# Patient Record
Sex: Female | Born: 1960 | Race: White | Hispanic: No | Marital: Single | Smoking: Never smoker
Health system: Southern US, Community
[De-identification: ages and names within clinical notes are randomized; demographics above are authoritative.]

## PROBLEM LIST (undated history)

## (undated) DIAGNOSIS — K5090 Crohn's disease, unspecified, without complications: Secondary | ICD-10-CM

## (undated) DIAGNOSIS — C4491 Basal cell carcinoma of skin, unspecified: Secondary | ICD-10-CM

## (undated) DIAGNOSIS — T7840XA Allergy, unspecified, initial encounter: Secondary | ICD-10-CM

## (undated) HISTORY — DX: Basal cell carcinoma of skin, unspecified: C44.91

## (undated) HISTORY — PX: DERMOID CYST  EXCISION: SHX1452

## (undated) HISTORY — DX: Crohn's disease, unspecified, without complications: K50.90

## (undated) HISTORY — PX: COLONOSCOPY: SHX174

## (undated) HISTORY — DX: Allergy, unspecified, initial encounter: T78.40XA

## (undated) HISTORY — PX: KNEE ARTHROSCOPY: SHX127

## (undated) HISTORY — PX: OTHER SURGICAL HISTORY: SHX169

---

## 2007-04-21 ENCOUNTER — Encounter: Admission: RE | Admit: 2007-04-21 | Discharge: 2007-04-21 | Payer: Self-pay | Admitting: Sports Medicine

## 2008-07-11 ENCOUNTER — Ambulatory Visit: Payer: Self-pay | Admitting: Sports Medicine

## 2008-12-25 ENCOUNTER — Ambulatory Visit: Payer: Self-pay | Admitting: Family Medicine

## 2009-05-02 ENCOUNTER — Ambulatory Visit: Payer: Self-pay | Admitting: Sports Medicine

## 2009-11-20 ENCOUNTER — Encounter: Admission: RE | Admit: 2009-11-20 | Discharge: 2009-11-20 | Payer: Self-pay | Admitting: Family Medicine

## 2009-11-20 ENCOUNTER — Ambulatory Visit: Payer: Self-pay | Admitting: Family Medicine

## 2009-11-28 ENCOUNTER — Ambulatory Visit: Payer: Self-pay | Admitting: Family Medicine

## 2010-01-08 ENCOUNTER — Encounter: Admission: RE | Admit: 2010-01-08 | Discharge: 2010-01-08 | Payer: Self-pay | Admitting: Podiatry

## 2011-01-16 ENCOUNTER — Ambulatory Visit: Payer: Self-pay | Admitting: Family Medicine

## 2011-03-07 ENCOUNTER — Encounter: Payer: Self-pay | Admitting: Family Medicine

## 2011-06-24 ENCOUNTER — Ambulatory Visit (INDEPENDENT_AMBULATORY_CARE_PROVIDER_SITE_OTHER): Payer: BC Managed Care – PPO | Admitting: Family Medicine

## 2011-06-24 ENCOUNTER — Encounter: Payer: Self-pay | Admitting: Family Medicine

## 2011-06-24 VITALS — BP 120/80 | HR 72 | Wt 160.0 lb

## 2011-06-24 DIAGNOSIS — M25512 Pain in left shoulder: Secondary | ICD-10-CM

## 2011-06-24 DIAGNOSIS — M25519 Pain in unspecified shoulder: Secondary | ICD-10-CM

## 2011-06-24 NOTE — Patient Instructions (Signed)
Call me if you have any questions

## 2011-06-24 NOTE — Progress Notes (Signed)
  Subjective:    Patient ID: Ruth Carpenter, female    DOB: Oct 27, 1960, 50 y.o.   MRN: [id number]  HPI She is here for dilation of left shoulder pain. She has a history of injury Ms. several years ago. She did have an MRI in 2010 which did show questionable tendon damage. She is again had difficulty recently and was seen by an orthopedic surgeon. An ultrasound is scheduled in the future. She is concerned about the fact that the pain is more than just in her shoulder. There is a questionable history of lymph node swelling in the axilla from 2 years ago.   Review of Systems     Objective:   Physical Exam Alert and in no distress. Exam of her neck shows no adenopathy. Axillary exam again showed no adenopathy.       Assessment & Plan:  Left shoulder pain. Explained to her that the adenopathy from 2 years ago is not significant based on the fact that nothing is present now and if this was of any significance, it would've 40 blossomed by this point. She understands this and will followup as needed .

## 2011-10-27 ENCOUNTER — Ambulatory Visit: Payer: Self-pay | Admitting: Family Medicine

## 2012-05-11 HISTORY — PX: ROTATOR CUFF REPAIR: SHX139

## 2012-06-01 DIAGNOSIS — M25512 Pain in left shoulder: Secondary | ICD-10-CM | POA: Insufficient documentation

## 2013-05-24 ENCOUNTER — Telehealth: Payer: Self-pay | Admitting: Family Medicine

## 2013-05-24 NOTE — Telephone Encounter (Signed)
Sure--as long as the wait for a CPE appt isn't a problem for her

## 2013-05-25 NOTE — Telephone Encounter (Signed)
Pt scheduled appt

## 2013-05-26 ENCOUNTER — Encounter: Payer: Self-pay | Admitting: Family Medicine

## 2013-05-26 ENCOUNTER — Ambulatory Visit (INDEPENDENT_AMBULATORY_CARE_PROVIDER_SITE_OTHER): Payer: BC Managed Care – PPO | Admitting: Family Medicine

## 2013-05-26 VITALS — BP 110/80 | HR 68 | Ht 67.0 in | Wt 166.0 lb

## 2013-05-26 DIAGNOSIS — M25559 Pain in unspecified hip: Secondary | ICD-10-CM

## 2013-05-26 DIAGNOSIS — K5090 Crohn's disease, unspecified, without complications: Secondary | ICD-10-CM

## 2013-05-26 DIAGNOSIS — M545 Low back pain: Secondary | ICD-10-CM

## 2013-05-26 DIAGNOSIS — M25552 Pain in left hip: Secondary | ICD-10-CM

## 2013-05-26 LAB — POCT URINALYSIS DIPSTICK
Bilirubin, UA: NEGATIVE
Glucose, UA: NEGATIVE
Ketones, UA: NEGATIVE
Leukocytes, UA: NEGATIVE
Urobilinogen, UA: NEGATIVE
pH, UA: 5

## 2013-05-26 MED ORDER — MELOXICAM 15 MG PO TABS: 15.0000 mg | ORAL_TABLET | Freq: Every day | ORAL | Status: DC

## 2013-05-26 NOTE — Patient Instructions (Signed)
Consider chiropractor if not improving--([facility], Dr. Thereasa Distance or his partner Clayburn Pert, or [facility]ien (?Peters?)--preferably one who performs Active Release Technique  Continue to use heat, stretches. Use prilosec or zantac or pepcid as needed for stomach pain related to medication.  Get flu shot at school

## 2013-05-26 NOTE — Progress Notes (Signed)
Chief Complaint  Patient presents with  . Hip Pain    left hip pain and lower back pain x 6-8 weeks. Worse at night when she is lying down. Has been getting HA more frequently than usual lately. Unsure if she wants flu vaccine. Patient does want it noted that she will  not take any steroidal medications due to a personal preference, not an allergy.    She was feeling bloated, feels like she gets full easy, even with drinking water.  Thought it might have been her Crohns at first, but now doesn't think it is.  She has had some L hip pain for a bit, but then improved.  Pain has been going on for 6-8 weeks.  Now she is having some discomfort in her back, notices pain with lying in bed at night.  She had muscle spasms in her lower back with crohn's flare in the past, but this feels different.  Lying flat causes the most pain, ocross the left side of her lower back, not into the buttock.  It hurts for her to move her leg to get up.  Feels better once she is up and moving around.  Denies radiation of pain into leg, or any numbness or tingling.  With prolonged sitting, she has some pain in L low back and lateral hip.  Did some stretches with a physical therapist, didn't help much (other than the pain moving from the hip more to to the back).  Denies any fall, injury or change in activity.  She took 800mg  of ibuprofen twice daily at onset of pain, for about a week early on in the course of pain. Didn't seem to help.  Feels like Aleve makes her swell, no problems with ibuprofen.  Bowels are "normal for me"--no blood in stool, diarrhea or abdominal pain.  Sometimes certain foods will bother her stomach. Denies any heartburn.Denies nausea or vomiting but some mild early satiety.  Past Medical History  Diagnosis Date  . Crohn's disease     GI in [place], [place]  . Allergy     h/o in past--no ongoing issues   Past Surgical History  Procedure Laterality Date  . Rotator cuff repair Left 05/2012    bicep release,  rotator cuff and labrum repair ([facility]; Dr. Ardine Eng)  . Knee arthroscopy Bilateral     meniscus tears  . Salivary gland excision    . Dermoid cyst  excision Right age 84's    with R ovarectomy   History   Social History  . Marital Status: Single    Spouse Name: N/A    Number of Children: N/A  . Years of Education: N/A   Occupational History  . head softball coach [employer]   Social History Main Topics  . Smoking status: Never Smoker   . Smokeless tobacco: Never Used  . Alcohol Use: No     Comment: no drinks 6-7 years.   . Drug Use: No  . Sexual Activity: Not on file   Other Topics Concern  . Not on file   Social History Narrative   Lives with a roommate Vinnie Langton), 2 [place]   Current Outpatient Prescriptions on File Prior to Visit  Medication Sig Dispense Refill  . fish oil-omega-3 fatty acids 1000 MG capsule Take 1 g by mouth daily.       . Multiple Vitamins-Calcium (ONE-A-DAY WOMENS) tablet Take 1 tablet by mouth daily.         No current facility-administered medications on file  prior to visit.   Allergies  Allergen Reactions  . Guaifenesin Er Diarrhea and Nausea Only  . Morphine And Related Hives and Itching   ROS:  No cough, shortness of breath.  Has throat-clearing in the mornings, but denies any allergy symptoms. Not sleeping great due to back pain, so a little tired during the day.  No fevers, chills, nausea, vomiting. No skin rashes, bleeding/bruising.  No other joint pains.  Denies urinary urgency, dysuria.  Some frequency, but drinks a lot of water.  PHYSICAL EXAM: BP 110/80  Pulse 68  Ht 5\' 7"  (1.702 m)  Wt 166 lb (75.297 kg)  BMI 25.99 kg/m2 Well developed, pleasant female in no distress Neck: no lymphadenopathy or mass Heart: regular rate and rhythm without murmur Lungs: clear bilaterally Abdomen: Mild epigastric tenderness, and mild diffuse tenderness across lower abdomen. No rebound tendernes or guarding. Normal bowel sounds.  No organomegaly  or mass Back: No spinal tendreness, no CVA tenderness, no SI joint tenderness. There is mild asymmetry of paraspinous muscles, somewhat thicker (mild spasm) on the left.  Mild diffuse tenderness in this area.  nontender over sciatic notch and trochanteric bursa. nontender at ASIS.  FROM of hip. No pain with pyriformis stretch, normal ROM  ASSESSMENT/PLAN:  LBP (low back pain) - Plan: POCT Urinalysis Dipstick, meloxicam (MOBIC) 15 MG tablet  Left hip pain - Plan: meloxicam (MOBIC) 15 MG tablet  Crohn's disease  Trial of heat, stretches, NSAIDS NSAID precautions reviewed. Consider chiropractor if not improving. Use prilosec or zantac or pepcid as needed for stomach pain related to medication.  F/u with GI for GI complaints   Declines flu shots.  Encouraged to get at [facility].

## 2013-08-12 ENCOUNTER — Other Ambulatory Visit (INDEPENDENT_AMBULATORY_CARE_PROVIDER_SITE_OTHER): Payer: BC Managed Care – PPO

## 2013-08-12 DIAGNOSIS — Z23 Encounter for immunization: Secondary | ICD-10-CM

## 2013-10-10 ENCOUNTER — Encounter: Payer: Self-pay | Admitting: Family Medicine

## 2013-10-10 ENCOUNTER — Other Ambulatory Visit (HOSPITAL_COMMUNITY)
Admission: RE | Admit: 2013-10-10 | Discharge: 2013-10-10 | Disposition: A | Discharge status: Home / Self Care | Payer: BC Managed Care – PPO | Source: Ambulatory Visit | Attending: Family Medicine | Admitting: Family Medicine

## 2013-10-10 ENCOUNTER — Ambulatory Visit (INDEPENDENT_AMBULATORY_CARE_PROVIDER_SITE_OTHER): Payer: BC Managed Care – PPO | Admitting: Family Medicine

## 2013-10-10 VITALS — BP 116/72 | HR 64 | Ht 66.25 in | Wt 155.0 lb

## 2013-10-10 DIAGNOSIS — M25511 Pain in right shoulder: Secondary | ICD-10-CM

## 2013-10-10 DIAGNOSIS — Z1151 Encounter for screening for human papillomavirus (HPV): Secondary | ICD-10-CM | POA: Insufficient documentation

## 2013-10-10 DIAGNOSIS — Z01419 Encounter for gynecological examination (general) (routine) without abnormal findings: Secondary | ICD-10-CM | POA: Insufficient documentation

## 2013-10-10 DIAGNOSIS — M25519 Pain in unspecified shoulder: Secondary | ICD-10-CM

## 2013-10-10 DIAGNOSIS — K5090 Crohn's disease, unspecified, without complications: Secondary | ICD-10-CM

## 2013-10-10 DIAGNOSIS — E28319 Asymptomatic premature menopause: Secondary | ICD-10-CM

## 2013-10-10 DIAGNOSIS — Z8249 Family history of ischemic heart disease and other diseases of the circulatory system: Secondary | ICD-10-CM

## 2013-10-10 DIAGNOSIS — Z23 Encounter for immunization: Secondary | ICD-10-CM

## 2013-10-10 DIAGNOSIS — Z0000 Encounter for general adult medical examination without abnormal findings: Secondary | ICD-10-CM

## 2013-10-10 LAB — CBC WITH DIFFERENTIAL/PLATELET
BASOS ABS: 0 10*3/uL (ref 0.0–0.1)
Basophils Relative: 0 % (ref 0–1)
EOS ABS: 0.1 10*3/uL (ref 0.0–0.7)
EOS PCT: 1 % (ref 0–5)
HCT: 39.8 % (ref 36.0–46.0)
Hemoglobin: 13.7 g/dL (ref 12.0–15.0)
LYMPHS ABS: 1.7 10*3/uL (ref 0.7–4.0)
LYMPHS PCT: 30 % (ref 12–46)
MCH: 29.3 pg (ref 26.0–34.0)
MCHC: 34.4 g/dL (ref 30.0–36.0)
MCV: 85 fL (ref 78.0–100.0)
Monocytes Absolute: 0.3 10*3/uL (ref 0.1–1.0)
Monocytes Relative: 6 % (ref 3–12)
NEUTROS ABS: 3.7 10*3/uL (ref 1.7–7.7)
NEUTROS PCT: 63 % (ref 43–77)
PLATELETS: 229 10*3/uL (ref 150–400)
RBC: 4.68 MIL/uL (ref 3.87–5.11)
RDW: 13.6 % (ref 11.5–15.5)
WBC: 5.8 10*3/uL (ref 4.0–10.5)

## 2013-10-10 LAB — POCT URINALYSIS DIPSTICK
BILIRUBIN UA: NEGATIVE
Blood, UA: NEGATIVE
GLUCOSE UA: NEGATIVE
KETONES UA: NEGATIVE
Leukocytes, UA: NEGATIVE
NITRITE UA: NEGATIVE
PH UA: 5
Protein, UA: NEGATIVE
SPEC GRAV UA: 1.02
Urobilinogen, UA: NEGATIVE

## 2013-10-10 NOTE — Progress Notes (Signed)
Chief Complaint  Patient presents with  . fasting physical    fasting physical with pap.    Ruth Carpenter is a 53 y.o. female who presents for a complete physical.  She has the following concerns:  She fell at work on Friday, and is having right shoulder pain (with movement).  She has taken Tylenol (doesn't take NSAIDs because it messes up her stomach), and iced it through the weekend.  Has some soreness in her triceps, where she landed.  She was last seen for LBP in October.  Saw Damien (chiro) for a while.  Got busy with work.  Still having some back pain (overall improved, but persistent).  Plans to f/u with [facility] when she has time.  On Nutrisystem to eat healthier, lose weight. She started about 6-7 weeks ago, and lost 14 pounds, and "feels better".   Immunization History  Administered Date(s) Administered  . Influenza,inj,Quad PF,36+ Mos 08/12/2013  . Tdap 10/10/2013   Last Pap smear: 2008, no h/o abnormal paps Last mammogram: probably also in 2008, has been skeptical about getting them Last colonoscopy: per GI in [place], [place], thinks it was at her time of diagnosis, over 7-10 years ago. Last DEXA: unsure Periods stopped around the time of her diagnosis of Crohn's (7-10 years ago). Dentist: recent, every 6 months Ophtho: 2 years ago, wears glasses Exercise:  Active throughout the day (hitting balls, walks).  Not always aerobic.  Past Medical History  Diagnosis Date  . Crohn's disease     GI in [place], [place]  . Allergy     h/o in past--no ongoing issues  . Skin cancer, basal cell     Dr. Ubaldo Glassing (right shoulder, RLE, back, chest)    Past Surgical History  Procedure Laterality Date  . Rotator cuff repair Left 05/2012    bicep release, rotator cuff and labrum repair ([facility]; Dr. Gerald Dexter)  . Knee arthroscopy Bilateral     meniscus tears  . Salivary gland excision    . Dermoid cyst  excision Right age 62's    with R ovarectomy    History   Social History  . Marital  Status: Single    Spouse Name: N/A    Number of Children: N/A  . Years of Education: N/A   Occupational History  . head softball coach [place] History Main Topics  . Smoking status: Never Smoker   . Smokeless tobacco: Never Used  . Alcohol Use: No     Comment: no drinks over 7 years.   . Drug Use: No  . Sexual Activity: Not Currently   Other Topics Concern  . Not on file   Social History Narrative   Lives with a roommate [name]), 2 Beagles    Family History  Problem Relation Age of Onset  . Heart disease Mother     CABG at 65; smoker  . Diabetes Mother   . Hypertension Mother   . Heart disease Father 21    died of massive MI at 74  . Hypertension Father   . Hyperlipidemia Sister   . Hypertension Sister   . Diabetes Sister   . Hyperlipidemia Brother   . Hypertension Brother   . Diabetes Brother   . Cancer Maternal Aunt     ?type  . Cancer Maternal Grandmother     ?type  . Hyperlipidemia Brother   . Hypertension Brother   . Hyperlipidemia Brother   . Hypertension Brother   . Hyperlipidemia Sister   .  Hypertension Sister   . Diabetes Other    Outpatient Encounter Prescriptions as of 10/10/2013  Medication Sig  . cholecalciferol (VITAMIN D) 1000 UNITS tablet Take 1,000 Units by mouth daily.  . fish oil-omega-3 fatty acids 1000 MG capsule Take 1 g by mouth daily.   . Multiple Vitamins-Calcium (ONE-A-DAY WOMENS) tablet Take 1 tablet by mouth daily.    Marland Kitchen acetaminophen (TYLENOL) 325 MG tablet Take 650 mg by mouth as needed for pain.  . [DISCONTINUED] meloxicam (MOBIC) 15 MG tablet Take 1 tablet (15 mg total) by mouth daily.    Allergies  Allergen Reactions  . Guaifenesin Er Diarrhea and Nausea Only  . Morphine And Related Hives and Itching    ROS:  The patient denies anorexia, fever, headaches,  vision changes, decreased hearing, ear pain, sore throat, breast concerns, chest pain, palpitations, dizziness, syncope, dyspnea on exertion, cough,  swelling, nausea, vomiting, diarrhea, constipation, abdominal pain, melena, hematochezia, indigestion/heartburn, hematuria, incontinence, dysuria, vaginal bleeding, discharge, odor or itch, genital lesions, joint pains, numbness, tingling, weakness, tremor, suspicious skin lesions, depression, anxiety, abnormal bleeding/bruising, or enlarged lymph nodes. +intentional weight loss. Occasional bloating.  Denies any Crohn's symptoms (diarrhea, blood in stool).  Has been off Remicaid for years. +right shoulder pain per HPI. +rash on her abdomen sometimes all over, worse after shower.  She plans to see dermatologist soon (sees her regularly)  PHYSICAL EXAM: BP 116/72  Pulse 64  Ht 5' 6.25" (1.683 m)  Wt 155 lb (70.308 kg)  BMI 24.82 kg/m2  General Appearance:    Alert, cooperative, no distress, appears stated age  Head:    Normocephalic, without obvious abnormality, atraumatic  Eyes:    PERRL, conjunctiva/corneas clear, EOM's intact, fundi    benign  Ears:    Normal TM's and external ear canals. Mild TM scarring bilaterally.   Nose:   Nares normal, no drainage or sinus   Tenderness.  L nare--mild congestion, clear mucus  Throat:   Lips, mucosa, and tongue normal; teeth and gums normal  Neck:   Supple, no lymphadenopathy;  thyroid:  no   enlargement/tenderness/nodules; no carotid   bruit or JVD  Back:    Spine nontender, no curvature, ROM normal, no CVA     tenderness  Lungs:     Clear to auscultation bilaterally without wheezes, rales or     ronchi; respirations unlabored  Chest Wall:    No tenderness or deformity   Heart:    Regular rate and rhythm, S1 and S2 normal, no murmur, rub   or gallop  Breast Exam:    No tenderness, masses, or nipple discharge or inversion.      No axillary lymphadenopathy  Abdomen:     Soft, non-tender, nondistended, normoactive bowel sounds,    no masses, no hepatosplenomegaly  Genitalia:    Normal external genitalia without lesions.  Mild atrophic changes are  noted.  She had discomfort with speculum exam, limited ability to open the speculum.  Cervix was partially visualized, and appeared normal.  There was a stenotic cervical os. BUS and vagina normal; No abnormal vaginal discharge.  Uterus and adnexa not enlarged, nontender, no masses.  Pap performed  Rectal:    Normal tone, no masses or tenderness; guaiac negative stool  Extremities:   No clubbing, cyanosis or edema. Right shoulder: Painful arc, worse with abduction than forward flexion.  FROM.  Pain with supraspinatous testing--gave way due to pain (cannot r/o weakness). Rest of RC muscles had normal strength.  Pulses:  2+ and symmetric all extremities  Skin:   Skin color, texture, turgor normal; -mildly dry on abdomen, with some excoriation R abdomen.  Lymph nodes:   Cervical, supraclavicular, and axillary nodes normal  Neurologic:   CNII-XII intact, normal strength, sensation and gait; reflexes 2+ and symmetric throughout          Psych:   Normal mood, affect, hygiene and grooming.     ASSESSMENT/PLAN:  Routine general medical examination at a health care facility - Plan: POCT urinalysis dipstick, Lipid panel, Comprehensive metabolic panel, CBC with Differential, TSH, Cytology - PAP [place]  Crohn's disease - Plan: Comprehensive metabolic panel, CBC with Differential, DG Bone Density  Family history of early CAD - Plan: Lipid panel  Need for Tdap vaccination - Plan: Tdap vaccine greater than or equal to 7yo IM  Early menopause - Plan: DG Bone Density  Right shoulder pain - f/u with Work[employer]'s [employer].  may have RC injury.  shown ROM exercises.  doesn't tolerate NSAID--consider steroid injection (through [facility])  DEXA recommended (given early menopause). Encouraged to get mammograms.  Counseled re: risks, benefits, potential consequences of delay in diagnosis of cancer. She will consider. Pt to schedule colonoscopy  Discussed monthly self breast exams and yearly mammograms after the age of  38; at least 30 minutes of aerobic activity at least 5 days/week; proper sunscreen use reviewed; healthy diet, including goals of calcium and vitamin D intake and alcohol recommendations (less than or equal to 1 drink/day) reviewed; regular seatbelt use; changing batteries in smoke detectors.  Immunization recommendations discussed--Tdap given.  Colonoscopy recommendations reviewed--due, pt to schedule (GI in [place])

## 2013-10-10 NOTE — Patient Instructions (Addendum)
  HEALTH MAINTENANCE RECOMMENDATIONS:  It is recommended that you get at least 30 minutes of aerobic exercise at least 5 days/week (for weight loss, you may need as much as 60-90 minutes). This can be any activity that gets your heart rate up. This can be divided in 10-15 minute intervals if needed, but try and build up your endurance at least once a week.  Weight bearing exercise is also recommended twice weekly.  Eat a healthy diet with lots of vegetables, fruits and fiber.  "Colorful" foods have a lot of vitamins (ie green vegetables, tomatoes, red peppers, etc).  Limit sweet tea, regular sodas and alcoholic beverages, all of which has a lot of calories and sugar.  Up to 1 alcoholic drink daily may be beneficial for women (unless trying to lose weight, watch sugars).  Drink a lot of water.  Calcium recommendations are 1200-1500 mg daily (1500 mg for postmenopausal women or women without ovaries), and vitamin D 1000 IU daily.  This should be obtained from diet and/or supplements (vitamins), and calcium should not be taken all at once, but in divided doses.  Monthly self breast exams and yearly mammograms for women over the age of 50 is recommended.  Sunscreen of at least SPF 30 should be used on all sun-exposed parts of the skin when outside between the hours of 10 am and 4 pm (not just when at beach or pool, but even with exercise, golf, tennis, and yard work!)  Use a sunscreen that says "broad spectrum" so it covers both UVA and UVB rays, and make sure to reapply every 1-2 hours.  Remember to change the batteries in your smoke detectors when changing your clock times in the spring and fall.  Use your seat belt every time you are in a car, and please drive safely and not be distracted with cell phones and texting while driving.  Please consider mammogram (you can get at the same place, possibly scheduled on same day, as your bone density).  Please schedule your colonoscopy.

## 2013-10-11 LAB — COMPREHENSIVE METABOLIC PANEL
ALT: 15 U/L (ref 0–35)
AST: 19 U/L (ref 0–37)
Albumin: 4.7 g/dL (ref 3.5–5.2)
Alkaline Phosphatase: 52 U/L (ref 39–117)
BUN: 16 mg/dL (ref 6–23)
CHLORIDE: 105 meq/L (ref 96–112)
CO2: 27 meq/L (ref 19–32)
CREATININE: 0.77 mg/dL (ref 0.50–1.10)
Calcium: 9.9 mg/dL (ref 8.4–10.5)
Glucose, Bld: 85 mg/dL (ref 70–99)
Potassium: 4.7 mEq/L (ref 3.5–5.3)
Sodium: 141 mEq/L (ref 135–145)
TOTAL PROTEIN: 7.1 g/dL (ref 6.0–8.3)
Total Bilirubin: 1 mg/dL (ref 0.2–1.2)

## 2013-10-11 LAB — LIPID PANEL
Cholesterol: 173 mg/dL (ref 0–200)
HDL: 54 mg/dL (ref >39)
LDL CALC: 94 mg/dL (ref 0–99)
TRIGLYCERIDES: 125 mg/dL (ref <150)
Total CHOL/HDL Ratio: 3.2 Ratio
VLDL: 25 mg/dL (ref 0–40)

## 2013-10-11 LAB — TSH: TSH: 1.597 u[IU]/mL (ref 0.350–4.500)

## 2013-10-12 ENCOUNTER — Ambulatory Visit: Payer: Self-pay | Admitting: Medical

## 2013-10-19 ENCOUNTER — Ambulatory Visit (INDEPENDENT_AMBULATORY_CARE_PROVIDER_SITE_OTHER): Payer: BC Managed Care – PPO | Admitting: Family Medicine

## 2013-10-19 VITALS — BP 112/80 | HR 76 | Temp 99.6°F | Resp 16 | Ht 67.0 in | Wt 159.0 lb

## 2013-10-19 DIAGNOSIS — R52 Pain, unspecified: Secondary | ICD-10-CM

## 2013-10-19 DIAGNOSIS — R509 Fever, unspecified: Secondary | ICD-10-CM

## 2013-10-19 DIAGNOSIS — J101 Influenza due to other identified influenza virus with other respiratory manifestations: Secondary | ICD-10-CM

## 2013-10-19 DIAGNOSIS — J029 Acute pharyngitis, unspecified: Secondary | ICD-10-CM

## 2013-10-19 DIAGNOSIS — J111 Influenza due to unidentified influenza virus with other respiratory manifestations: Secondary | ICD-10-CM

## 2013-10-19 LAB — POCT INFLUENZA A/B
Influenza A, POC: POSITIVE
Influenza B, POC: NEGATIVE

## 2013-10-19 MED ORDER — HYDROCODONE-HOMATROPINE 5-1.5 MG/5ML PO SYRP: 5.0000 mL | ORAL_SOLUTION | ORAL | Status: DC | PRN

## 2013-10-19 MED ORDER — BENZONATATE 100 MG PO CAPS: 100.0000 mg | ORAL_CAPSULE | Freq: Three times a day (TID) | ORAL | Status: DC | PRN

## 2013-10-19 MED ORDER — OSELTAMIVIR PHOSPHATE 75 MG PO CAPS: 75.0000 mg | ORAL_CAPSULE | Freq: Two times a day (BID) | ORAL | Status: DC

## 2013-10-19 NOTE — Patient Instructions (Signed)
Drink plenty of fluids and get enough rest  You will be infectious for several days  Take the Tamiflu one twice daily  Use the hydrocodone cough syrup 1 teaspoon every 4-6 hours as needed. It is sedating.  Use the Tessalon cough pills one every 6 or 8 hours (3 in 24 hours) as needed for cough  Return in worse

## 2013-10-19 NOTE — Progress Notes (Signed)
Subjective: Patient is here because she got sick last night. She's had a cough. She has body aches. Is going for a temperature of 100.9 at home. Her throat is sore. 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  Objective: Pleasant lady who doesn't feel well. Her TMs are normal. Throat is only minimally erythematous. Neck supple without significant nodes. Chest clear to auscultation. Deep inspiration cause of her cough. Heart regular without murmurs. She looks ill.  Assessment: Flulike illness  Plan: Flu test Results for orders placed in visit on 10/19/13  POCT INFLUENZA A/B      Result Value Ref Range   Influenza A, POC Positive     Influenza B, POC Negative     Tamiflu, Hycodan, Tessalon

## 2013-11-01 ENCOUNTER — Other Ambulatory Visit: Payer: Self-pay | Admitting: Orthopedic Surgery

## 2013-11-01 DIAGNOSIS — M25511 Pain in right shoulder: Secondary | ICD-10-CM

## 2013-11-10 ENCOUNTER — Ambulatory Visit
Admission: RE | Admit: 2013-11-10 | Discharge: 2013-11-10 | Disposition: A | Discharge status: Home / Self Care | Payer: Worker's Compensation | Source: Ambulatory Visit | Attending: Orthopedic Surgery | Admitting: Orthopedic Surgery

## 2013-11-10 DIAGNOSIS — M25511 Pain in right shoulder: Secondary | ICD-10-CM

## 2013-11-10 MED ORDER — IOHEXOL 180 MG/ML  SOLN
15.0000 mL | Freq: Once | INTRAMUSCULAR | Status: AC | PRN
  Administered 2013-11-10: 15 mL via INTRA_ARTICULAR

## 2014-06-23 ENCOUNTER — Other Ambulatory Visit: Payer: Self-pay | Admitting: Dermatology

## 2014-08-09 ENCOUNTER — Ambulatory Visit (INDEPENDENT_AMBULATORY_CARE_PROVIDER_SITE_OTHER): Payer: BC Managed Care – PPO | Admitting: Family Medicine

## 2014-08-09 ENCOUNTER — Encounter: Payer: Self-pay | Admitting: Family Medicine

## 2014-08-09 VITALS — BP 100/68 | HR 88 | Temp 98.4°F | Ht 66.25 in | Wt 154.0 lb

## 2014-08-09 DIAGNOSIS — J029 Acute pharyngitis, unspecified: Secondary | ICD-10-CM

## 2014-08-09 DIAGNOSIS — J069 Acute upper respiratory infection, unspecified: Secondary | ICD-10-CM

## 2014-08-09 LAB — POCT RAPID STREP A (OFFICE): RAPID STREP A SCREEN: NEGATIVE

## 2014-08-09 NOTE — Patient Instructions (Signed)
  Drink plenty of fluids. Continue the decongestant (phenylphrine or pseudoephedrine), continue expectorant (guaifenesin).  Continue to use either tylenol and/or ibuprofen as needed for pain. Consider chloraseptic spray and/or salt water gargles for sore throat. If/when cough worsens you can use a cough suppressant such as dextromethorphan (DM in many of the combination medications, or Delsym syrup).  Expect symptoms of congestion and cough to worsen before they improve.  Call on Monday if symptoms are clearly worse rather than better.  Contact me through MyChart over the weekend if symptoms change, high fevers, etc.

## 2014-08-09 NOTE — Progress Notes (Signed)
Chief Complaint  Patient presents with  . Sore Throat    started yesterday with scratchy throat. By middle of the night she was having swallowing trouble. Coughing up brown stuff.    Yesterday she started with scratchy throat.  She started taking tylenol sinus severe (APAP, guaifenesin and phenylephrine).  Sore throat has gotten progressively worse.  She has postnasal drainage, and the phlegm is brown.  She is having some head congestion (mild).  Her ears are bothering her just a little.  No sinus pain.  She has felt a little feverish (her temp was 98.7, which is up from her usual numbers of 96.1)  +sick contacts (recent visit with family)  [facility], [place], [place] reviewed. Current Outpatient Prescriptions on File Prior to Visit  Medication Sig Dispense Refill  . cholecalciferol (VITAMIN D) 1000 UNITS tablet Take 1,000 Units by mouth daily.    . fish oil-omega-3 fatty acids 1000 MG capsule Take 1 g by mouth daily.     . Multiple Vitamins-Calcium (ONE-A-DAY WOMENS) tablet Take 1 tablet by mouth daily.      Marland Kitchen acetaminophen (TYLENOL) 325 MG tablet Take 650 mg by mouth as needed for pain.     No current facility-administered medications on file prior to visit.   Allergies  Allergen Reactions  . Guaifenesin Er Diarrhea and Nausea Only  . Morphine And Related Hives and Itching   ROS: No headaches, dizziness, nausea, vomiting, diarrhea.  No rashes. No urinary complaints, myalgias, chest pain, shortness of breath or cough.  See HPI.  PHYSICAL EXAM: BP 100/68 mmHg  Pulse 88  Temp(Src) 98.4 F (36.9 C) (Tympanic)  Ht 5' 6.25" (1.683 m)  Wt 154 lb (69.854 kg)  BMI 24.66 kg/m2  Well appearing female in no distress.  No coughing, occasional sniffle HEENT:  PERRL, EOMI, conjunctiva clear.  TM's and EAC's are normal.  Nasal mucosa is moderately to severely edematous on the left, mild-mod on the right.  Clear-white mucus is present.  Sinuses are nontender.  OP--there is erythema bilaterally and posteriorly.   The tonsils are not enlarged or red, no exudates.  Moist mucus membranes Neck: no lymphadenopathy or mass Heart: regular rate and rhythm Lungs: clear bilaterally Skin: no rash  Rapid strep negative  ASSESSMENT/PLAN:  Acute upper respiratory infection - viral.  supportive measures reviewed.  Sore throat - Plan: Rapid Strep A  She has listed intolerance to guaifenesin, but seems to be taking it without diarrhea in her OTC meds.  Advised to continue.   Drink plenty of fluids. Continue the decongestant (phenylphrine or pseudoephedrine), continue expectorant (guaifenesin).  Continue to use either tylenol and/or ibuprofen as needed for pain. Consider chloraseptic spray and/or salt water gargles for sore throat. If/when cough worsens you can use a cough suppressant such as dextromethorphan (DM in many of the combination medications, or Delsym syrup).  Expect symptoms of congestion and cough to worsen before they improve. Call next week if not improving or if worse.

## 2015-02-08 ENCOUNTER — Ambulatory Visit
Admission: RE | Admit: 2015-02-08 | Discharge: 2015-02-08 | Disposition: A | Discharge status: Home / Self Care | Payer: BLUE CROSS/BLUE SHIELD | Source: Ambulatory Visit | Attending: Internal Medicine | Admitting: Internal Medicine

## 2015-02-08 ENCOUNTER — Other Ambulatory Visit: Payer: Self-pay | Admitting: Internal Medicine

## 2015-02-08 DIAGNOSIS — M25551 Pain in right hip: Secondary | ICD-10-CM

## 2015-02-28 ENCOUNTER — Other Ambulatory Visit: Payer: Self-pay | Admitting: Family Medicine

## 2015-02-28 DIAGNOSIS — M25551 Pain in right hip: Secondary | ICD-10-CM

## 2015-03-12 ENCOUNTER — Ambulatory Visit
Admission: RE | Admit: 2015-03-12 | Discharge: 2015-03-12 | Disposition: A | Discharge status: Home / Self Care | Payer: BLUE CROSS/BLUE SHIELD | Source: Ambulatory Visit | Attending: Family Medicine | Admitting: Family Medicine

## 2015-03-12 DIAGNOSIS — M25551 Pain in right hip: Secondary | ICD-10-CM

## 2015-03-29 DIAGNOSIS — M1611 Unilateral primary osteoarthritis, right hip: Secondary | ICD-10-CM | POA: Insufficient documentation

## 2015-11-09 ENCOUNTER — Ambulatory Visit
Admission: RE | Admit: 2015-11-09 | Discharge: 2015-11-09 | Disposition: A | Discharge status: Home / Self Care | Payer: BLUE CROSS/BLUE SHIELD | Source: Ambulatory Visit | Attending: Family Medicine | Admitting: Family Medicine

## 2015-11-09 ENCOUNTER — Other Ambulatory Visit: Payer: Self-pay | Admitting: Family Medicine

## 2015-11-09 DIAGNOSIS — M898X1 Other specified disorders of bone, shoulder: Secondary | ICD-10-CM

## 2015-11-09 DIAGNOSIS — G8929 Other chronic pain: Secondary | ICD-10-CM

## 2015-11-09 DIAGNOSIS — R05 Cough: Secondary | ICD-10-CM

## 2015-11-09 DIAGNOSIS — R059 Cough, unspecified: Secondary | ICD-10-CM

## 2016-05-28 ENCOUNTER — Encounter: Payer: Self-pay | Admitting: *Deleted

## 2016-11-25 ENCOUNTER — Ambulatory Visit: Payer: Self-pay | Admitting: Medical

## 2016-11-25 VITALS — BP 122/82 | HR 78 | Temp 98.4°F | Resp 16 | Ht 67.0 in | Wt 162.0 lb

## 2016-11-25 DIAGNOSIS — J111 Influenza due to unidentified influenza virus with other respiratory manifestations: Secondary | ICD-10-CM

## 2016-11-25 MED ORDER — OSELTAMIVIR PHOSPHATE 75 MG PO CAPS: 75.0000 mg | ORAL_CAPSULE | Freq: Two times a day (BID) | ORAL | 0 refills | Status: AC

## 2016-11-25 NOTE — Progress Notes (Signed)
   Subjective:    Patient ID: Ruth Carpenter, female    DOB: 30-Oct-1960, 56 y.o.   MRN: [id number]  HPI  56 yo female work up at  4:44am with sore throat, ears burning, sweating, body aches and chills 2 hours ago. Took tylenol took two  500mg   At 5am , got ready for work. Took a zyrtec-D and flonase about 11am. Mild productive cough brownish in color, no blood. Feels fatigued more than usual. Feels like she could take a nap.    Review of Systems  Constitutional: Positive for chills and fever.  HENT: Positive for congestion, ear pain, postnasal drip, rhinorrhea, sore throat and trouble swallowing. Negative for ear discharge, sinus pain, sinus pressure, sneezing, tinnitus and voice change.   Eyes: Negative for discharge and itching.  Respiratory: Positive for cough. Negative for chest tightness and shortness of breath.   Cardiovascular: Negative for chest pain.  Gastrointestinal: Positive for diarrhea. Negative for nausea and vomiting.  Endocrine: Negative.   Genitourinary: Negative for dysuria and frequency.  Musculoskeletal: Positive for myalgias. Negative for arthralgias, back pain and joint swelling.  Skin: Negative for rash.  Allergic/Immunologic: Positive for environmental allergies. Negative for food allergies.  Neurological: Negative for dizziness, syncope and light-headedness.  Psychiatric/Behavioral: Negative for confusion and hallucinations.       Objective:   Physical Exam  Constitutional: She appears well-developed and well-nourished.  HENT:  Head: Normocephalic and atraumatic.  Right Ear: External ear normal.  Left Ear: External ear normal.  Eyes: EOM are normal. Pupils are equal, round, and reactive to light.  Neck: Normal range of motion. Neck supple.  Cardiovascular: Normal rate, regular rhythm and normal heart sounds.   Lymphadenopathy:    She has cervical adenopathy.  Nursing note and vitals reviewed.         Assessment & Plan:  Influenza prescribed Tamiflu  75mg  one by mouth twice daily x  5 days  #10 no refills. Rest , increase fluids, monitor and treat fever take otc motrin or tylenol, take as directed. Reviewed with patient she needs to be fever free x 48 hours without tylenol or motrin. She wants to return to work tomorrow, again restated the recommendation of 48 hours free of fever without motrin or tylenol in her system.

## 2017-06-23 DIAGNOSIS — J029 Acute pharyngitis, unspecified: Secondary | ICD-10-CM | POA: Diagnosis not present

## 2017-06-23 DIAGNOSIS — J019 Acute sinusitis, unspecified: Secondary | ICD-10-CM | POA: Diagnosis not present

## 2017-09-18 DIAGNOSIS — L570 Actinic keratosis: Secondary | ICD-10-CM | POA: Diagnosis not present

## 2017-09-18 DIAGNOSIS — D2271 Melanocytic nevi of right lower limb, including hip: Secondary | ICD-10-CM | POA: Diagnosis not present

## 2017-09-18 DIAGNOSIS — D2272 Melanocytic nevi of left lower limb, including hip: Secondary | ICD-10-CM | POA: Diagnosis not present

## 2017-09-18 DIAGNOSIS — Z85828 Personal history of other malignant neoplasm of skin: Secondary | ICD-10-CM | POA: Diagnosis not present

## 2017-09-18 DIAGNOSIS — C44612 Basal cell carcinoma of skin of right upper limb, including shoulder: Secondary | ICD-10-CM | POA: Diagnosis not present

## 2017-09-18 DIAGNOSIS — D225 Melanocytic nevi of trunk: Secondary | ICD-10-CM | POA: Diagnosis not present

## 2017-09-30 DIAGNOSIS — C44612 Basal cell carcinoma of skin of right upper limb, including shoulder: Secondary | ICD-10-CM | POA: Diagnosis not present

## 2018-06-04 ENCOUNTER — Ambulatory Visit (INDEPENDENT_AMBULATORY_CARE_PROVIDER_SITE_OTHER): Payer: BLUE CROSS/BLUE SHIELD | Admitting: Family Medicine

## 2018-06-04 VITALS — BP 120/70 | HR 72 | Temp 98.8°F | Resp 14

## 2018-06-04 DIAGNOSIS — F40243 Fear of flying: Secondary | ICD-10-CM

## 2018-06-04 NOTE — Progress Notes (Signed)
Patient presents today for medication refill.  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  Patient usually takes Xanax 0.5 mg prior to a flight which seems to have worked in the past.  ROS: Negative except mentioned above. Vitals as per Epic.  GENERAL: NAD NEURO: CN II-XII grossly intact   A/P: Anxiety with flying -patient given prescription for Xanax 0.5 mg to use prior to flying.  Discussed with patient not to use with any other medication or alcohol.

## 2018-07-20 ENCOUNTER — Ambulatory Visit (INDEPENDENT_AMBULATORY_CARE_PROVIDER_SITE_OTHER): Payer: BLUE CROSS/BLUE SHIELD | Admitting: Family Medicine

## 2018-07-20 VITALS — BP 111/56 | HR 76 | Temp 98.4°F | Resp 14

## 2018-07-20 DIAGNOSIS — J069 Acute upper respiratory infection, unspecified: Secondary | ICD-10-CM

## 2018-07-20 MED ORDER — AZITHROMYCIN 250 MG PO TABS: ORAL_TABLET | ORAL | 0 refills | Status: DC

## 2018-07-20 NOTE — Progress Notes (Signed)
Patient presents today with symptoms of sore throat, nasal congestion, mild productive cough.  Patient states that she has had symptoms for the last 4 to 5 days.  She feels like she has significant mucus in her throat.  She states the mucus is yellow in color.  She denies any chest pain, shortness of breath, wheezing, headache, nausea, vomiting, abdominal pain.  Patient is not a smoker.  She denies any history of asthma.  She does not take any medications today.  She has been taking antihistamine/decongestant and Flonase for her symptoms.  She started to have the symptoms after she returned from [place].  ROS: Negative except mentioned above. Vitals as per Epic. GENERAL: NAD HEENT: mild pharyngeal erythema, no exudate, no erythema of TMs, positive air-fluid bubbles noted in right ear, no cervical LAD RESP: CTA B CARD: RRR NEURO: CN II-XII grossly intact   A/P: URI -rapid strep test was negative, will treat patient with Z-Pak, continue antihistamine/decongestant and Flonase, Delsym as needed for cough, rest, hydration, seek medical attention if symptoms persist or worsen as discussed.

## 2018-09-03 DIAGNOSIS — Z85828 Personal history of other malignant neoplasm of skin: Secondary | ICD-10-CM | POA: Diagnosis not present

## 2018-09-03 DIAGNOSIS — L820 Inflamed seborrheic keratosis: Secondary | ICD-10-CM | POA: Diagnosis not present

## 2018-09-03 DIAGNOSIS — L918 Other hypertrophic disorders of the skin: Secondary | ICD-10-CM | POA: Diagnosis not present

## 2018-09-03 DIAGNOSIS — D485 Neoplasm of uncertain behavior of skin: Secondary | ICD-10-CM | POA: Diagnosis not present

## 2018-09-03 DIAGNOSIS — L570 Actinic keratosis: Secondary | ICD-10-CM | POA: Diagnosis not present

## 2019-09-20 ENCOUNTER — Telehealth: Payer: Self-pay | Admitting: Medical

## 2019-09-20 ENCOUNTER — Encounter: Payer: Self-pay | Admitting: *Deleted

## 2019-09-20 ENCOUNTER — Other Ambulatory Visit: Payer: Self-pay | Admitting: Medical

## 2019-09-20 ENCOUNTER — Other Ambulatory Visit: Payer: Self-pay

## 2019-09-20 ENCOUNTER — Ambulatory Visit: Payer: Self-pay | Admitting: *Deleted

## 2019-09-20 DIAGNOSIS — J029 Acute pharyngitis, unspecified: Secondary | ICD-10-CM

## 2019-09-20 DIAGNOSIS — Z20822 Contact with and (suspected) exposure to covid-19: Secondary | ICD-10-CM

## 2019-09-20 DIAGNOSIS — H9202 Otalgia, left ear: Secondary | ICD-10-CM

## 2019-09-20 LAB — POC COVID19 BINAXNOW: SARS Coronavirus 2 Ag: NEGATIVE

## 2019-09-20 MED ORDER — AMOXICILLIN 875 MG PO TABS
875.0000 mg | ORAL_TABLET | Freq: Two times a day (BID) | ORAL | 0 refills | Status: DC
Start: 2019-09-20 — End: 2020-06-27

## 2019-09-20 NOTE — Progress Notes (Signed)
ere for Covid-19 testing. Symptoms of ST and left ear pain started yesterday. She is the Press photographer. (see documentation from earlier today).  POCT Covid -19 test was negative. PCR Covid test ordered.   Orders Placed This Encounter  Procedures  . Novel Coronavirus, NAA ([facility])    Coaches softball at [employer] Specific Question:   Is this test for diagnosis or screening    Answer:   Diagnosis of ill patient    Order Specific Question:   Symptomatic for COVID-19 as defined by CDC    Answer:   Yes    Order Specific Question:   Date of Symptom Onset    Answer:   09/19/2019    Order Specific Question:   Hospitalized for COVID-19    Answer:   No    Order Specific Question:   Admitted to ICU for COVID-19    Answer:   No    Order Specific Question:   Previously tested for COVID-19    Answer:   No    Order Specific Question:   Resident in a congregate (group) care setting    Answer:   No    Order Specific Question:   Is the patient student?    Answer:   No    Order Specific Question:   Employed in healthcare setting    Answer:   No    Order Specific Question:   Pregnant    Answer:   No    Order Specific Question:   Release to patient    Answer:   Immediate  . POC COVID-19    Order Specific Question:   Is this test for diagnosis or screening    Answer:   Diagnosis of ill patient    Order Specific Question:   Symptomatic for COVID-19 as defined by CDC    Answer:   Yes    Order Specific Question:   Date of Symptom Onset    Answer:   09/19/2019    Order Specific Question:   Hospitalized for COVID-19    Answer:   No    Order Specific Question:   Admitted to ICU for COVID-19    Answer:   No    Order Specific Question:   Previously tested for COVID-19    Answer:   No    Order Specific Question:   Resident in a congregate (group) care setting    Answer:   No    Order Specific Question:   Employed in healthcare setting    Answer:   No    Order Specific  Question:   Pregnant    Answer:   No   Patient communicated to  that she needs to quarantine till Covid-19 PCR test has resulted. She verbalizes understanding and has no questions at discharge.

## 2019-09-20 NOTE — Progress Notes (Signed)
  Permission for telemedicine, consent given by patient. 59 yo female  In non acute distress.  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 Increased left ear pain with swallowing. She denies any fever or chills, cough or shortness of breath, or  chest pain. Last tested for Covid-19 Sunday which was negative. She tests 3 times per week due to her position at [employer]. She took [place] and Tylenol last night which did help her symptoms.She states she has been diligent with wearing her mask except when home.   Review of Systems  Constitutional: Negative for chills and fever.  HENT: Positive for ear pain (both, left is worse, aches all the time) and sore throat (clearing throat). Negative for congestion, ear discharge and sinus pain.   Respiratory: Negative for cough and shortness of breath.   Cardiovascular: Positive for PND. Negative for chest pain.   On the phone she is constantly clearing her throat, no cough is noted. She is AXOX3. She does not sound as if she has nasal congestion.   Allergies  Allergen Reactions  . Guaifenesin Er Diarrhea and Nausea Only  . Morphine And Related Hives and Itching    Current Outpatient Medications:  .  acetaminophen (TYLENOL) 325 MG tablet, Take 650 mg by mouth as needed for pain., Disp: , Rfl:  .  amoxicillin (AMOXIL) 875 MG tablet, Take 1 tablet (875 mg total) by mouth 2 (two) times daily., Disp: 20 tablet, Rfl: 0 .  cholecalciferol (VITAMIN D) 1000 UNITS tablet, Take 1,000 Units by mouth daily., Disp: , Rfl:  .  fish oil-omega-3 fatty acids 1000 MG capsule, Take 1 g by mouth daily. , Disp: , Rfl:  .  Multiple Vitamins-Calcium (ONE-A-DAY WOMENS) tablet, Take 1 tablet by mouth daily.  , Disp: , Rfl:  .  pseudoephedrine-acetaminophen (TYLENOL SINUS) 30-500 MG TABS, Take 2 tablets by mouth every 4 (four) hours as needed., Disp: , Rfl:    Past Medical  History:  Diagnosis Date  . Allergy    h/o in past--no ongoing issues  . Crohn's disease    GI in [place], [place]  . Crohn's disease   . Skin cancer, basal cell    Dr. Ubaldo Glassing (right shoulder, RLE, back, chest)   Dx/Plan  Left ear pain/ sore throat. Will do testing for Covid-19 at 11:30am today( scheduled),she is at home currently) if negative will do PCR testing. Prescribed antibiotics, recommended a daily anitihistimine like OTC Zyrtec or Claritin and decongestant like sudafed. To continue pain medication OTC Tylenol or Motrin per package instructions as needed for pain. She verbalizes understanding and has no questions at the end of our conversation. Patient to isolate till PCR test results are received, use drive-thru pharmacy, and continue to mask up. Meds ordered this encounter  Medications  . amoxicillin (AMOXIL) 875 MG tablet    Sig: Take 1 tablet (875 mg total) by mouth 2 (two) times daily.    Dispense:  20 tablet    Refill:  0   Lab Orders     POC COVID-19

## 2019-09-21 ENCOUNTER — Encounter: Payer: Self-pay | Admitting: *Deleted

## 2019-09-22 ENCOUNTER — Telehealth: Payer: Self-pay

## 2019-09-22 LAB — NOVEL CORONAVIRUS, NAA: SARS-CoV-2, NAA: NOT DETECTED

## 2019-09-22 NOTE — Telephone Encounter (Addendum)
Covid PCR negative.  Patient notified of test results and she no longer has to remain in isolation. States that she feels much better and doesn't need a follow up visit at this time.  She will let us know if her symptoms change or start to worsen. She verbalizes understanding of instructions.

## 2019-09-26 ENCOUNTER — Telehealth: Payer: Self-pay | Admitting: Medical

## 2019-09-26 ENCOUNTER — Encounter: Payer: Self-pay | Admitting: Medical

## 2019-09-26 ENCOUNTER — Other Ambulatory Visit: Payer: Self-pay

## 2019-09-26 DIAGNOSIS — H1032 Unspecified acute conjunctivitis, left eye: Secondary | ICD-10-CM

## 2019-09-26 MED ORDER — GENTAMICIN SULFATE 0.3 % OP SOLN: 1.0000 [drp] | OPHTHALMIC | 0 refills | Status: DC

## 2019-09-26 NOTE — Patient Instructions (Signed)
Wash hands before and after application of medication. Follow up in 3-5 days if not improving or worsening.May return to work/campus after  24 hours use of antibiotics. Own towel and wash cloth as not to spread infection at home. Change pillow case.     Viral Conjunctivitis, Adult  Viral conjunctivitis is an inflammation of the clear membrane that covers the white part of your eye and the inner surface of your eyelid (conjunctiva). The inflammation is caused by a viral infection. The blood vessels in the conjunctiva become inflamed, causing the eye to become red or pink, and often itchy. Viral conjunctivitis can be easily passed from one person to another (is contagious). This condition is often called pink eye. What are the causes? This condition is caused by a virus. A virus is a type of contagious germ. It can be spread by touching objects that have been contaminated with the virus, such as doorknobs or towels. It can also be passed through droplets, such as from coughing or sneezing. What are the signs or symptoms? Symptoms of this condition include:  Eye redness.  Tearing or watery eyes.  Itchy and irritated eyes.  Burning feeling in the eyes.  Clear drainage from the eye.  Swollen eyelids.  A gritty feeling in the eye.  Light sensitivity. This condition often occurs with other symptoms, such as a fever, nausea, or a rash. How is this diagnosed? This condition is diagnosed with a medical history and physical exam. If you have discharge from your eye, the discharge may be tested to rule out other causes of conjunctivitis. How is this treated? Viral conjunctivitis does not respond to medicines that kill bacteria (antibiotics). Treatment for viral conjunctivitis is directed at stopping a bacterial infection from developing in addition to the viral infection. Treatment also aims to relieve your symptoms, such as itching. This may be done with antihistamine drops or other eye  medicines. Rarely, steroid eye drops or antiviral medicines may be prescribed. Follow these instructions at home: Medicines   Take or apply over-the-counter and prescription medicines only as told by your health care provider.  Be very careful to avoid touching the edge of the eyelid with the eye drop bottle or ointment tube when applying medicines to the affected eye. Being careful this way will stop you from spreading the infection to the other eye or to other people. Eye care  Avoid touching or rubbing your eyes.  Apply a warm, wet, clean washcloth to your eye for 10-20 minutes, 3-4 times per day or as told by your health care provider.  If you wear contact lenses, do not wear them until the inflammation is gone and your health care provider says it is safe to wear them again. Ask your health care provider how to sterilize or replace your contact lenses before using them again. Wear glasses until you can resume wearing contacts.  Avoid wearing eye makeup until the inflammation is gone. Throw away any old eye cosmetics that may be contaminated.  Gently wipe away any drainage from your eye with a warm, wet washcloth or a cotton ball. General instructions  Change or wash your pillowcase every day or as told by your health care provider.  Do not share towels, pillowcases, washcloths, eye makeup, makeup brushes, contact lenses, or glasses. This may spread the infection.  Wash your hands often with soap and water. Use paper towels to dry your hands. If soap and water are not available, use hand sanitizer.  Try to avoid  contact with other people for one week or as told by your health care provider. Contact a health care provider if:  Your symptoms do not improve with treatment or they get worse.  You have increased pain.  Your vision becomes blurry.  You have a fever.  You have facial pain, redness, or swelling.  You have yellow or green drainage coming from your eye.  You have  new symptoms. This information is not intended to replace advice given to you by your health care provider. Make sure you discuss any questions you have with your health care provider. Document Revised: 11/16/2018 Document Reviewed: 02/12/2016 Elsevier Patient Education  [place].

## 2019-09-26 NOTE — Progress Notes (Signed)
  Consent given to treat patient by telemedicine.  Patient woke up with left eye redness in sclera. Denies discharge or visual changes blurry or double vision, does feel slightly tight with movement of the eye..Denies itching. She just was treated for Left ear pain/ sinusitis with Amoxil, currently still taking antibiotics. She is feeling much better since starting the antibiotic. Works at [employer] and does not want to spread her eye infection. Covied-19 PCR  test done on 09/20/2019 was negative. She did a POC test today and that was also negative.  Review of Systems  Constitutional: Negative.   HENT: Negative for congestion (resolved) and ear pain (better).   Eyes: Positive for redness (left). Negative for blurred vision, double vision, photophobia, pain and discharge.       Feels tight, but can move eyeball without difficulty  Respiratory: Negative.   Cardiovascular: Negative.   Neurological: Negative.        I feel great since starting antibiotics.     Allergies  Allergen Reactions  . Guaifenesin Er Diarrhea and Nausea Only  . Morphine And Related Hives and Itching    Current Outpatient Medications:  .  acetaminophen (TYLENOL) 325 MG tablet, Take 650 mg by mouth as needed for pain., Disp: , Rfl:  .  amoxicillin (AMOXIL) 875 MG tablet, Take 1 tablet (875 mg total) by mouth 2 (two) times daily., Disp: 20 tablet, Rfl: 0 .  cholecalciferol (VITAMIN D) 1000 UNITS tablet, Take 1,000 Units by mouth daily., Disp: , Rfl:  .  fish oil-omega-3 fatty acids 1000 MG capsule, Take 1 g by mouth daily. , Disp: , Rfl:  .  gentamicin (GARAMYCIN) 0.3 % ophthalmic solution, Place 1 drop into the left eye every 4 (four) hours. While awake, for  5-7 days., Disp: 5 mL, Rfl: 0 .  Multiple Vitamins-Calcium (ONE-A-DAY WOMENS) tablet, Take 1 tablet by mouth daily.  , Disp: , Rfl:  .  pseudoephedrine-acetaminophen (TYLENOL SINUS) 30-500 MG TABS, Take 2 tablets by mouth every 4 (four) hours as needed.,  Disp: , Rfl:   PE: No physical exam was preformed. All information accessed through phone call.   Alert and oriented, non acute distress. NO cough or shortness of breath noted. Pictures sent via text of both the left eye which is moderately injected and right eye appears with n normal limits. No upper or lower lid swelling or erythema. No discharge noted.  A/P Conjunctivitis left eye Meds ordered this encounter  Medications  . gentamicin (GARAMYCIN) 0.3 % ophthalmic solution    Sig: Place 1 drop into the left eye every 4 (four) hours. While awake, for  5-7 days.    Dispense:  5 mL    Refill:  0  Wash hands before and after application of medication. Follow up in 3-5 days if not improving or worsening.May return to work/campus after  24 hours use of antibiotics. Own towel and wash cloth as not to spread infection at home. Change pillow case. She verbalizes understanding and has no questions at the end of our conversation. Information Eston Esters sent in [place].

## 2019-10-21 ENCOUNTER — Ambulatory Visit: Payer: BLUE CROSS/BLUE SHIELD

## 2019-11-09 ENCOUNTER — Encounter: Payer: Self-pay | Admitting: Medical

## 2019-11-09 ENCOUNTER — Other Ambulatory Visit: Payer: Self-pay

## 2019-11-09 ENCOUNTER — Telehealth: Payer: Self-pay | Admitting: Medical

## 2019-11-09 DIAGNOSIS — K130 Diseases of lips: Secondary | ICD-10-CM

## 2019-11-09 NOTE — Progress Notes (Signed)
Permission to have a telemedicine appointment with patient , she gives her consent.    Tracey RN called patient to help get information about a needed phone call from me.   59 yo female in non acute distress has a history of  2-3  Weeks of "sore on inside lower lips", bending over or leaning causes it to swell. Patient asked to send a photo. 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    She denies any trauma to the area.   Allergies  Allergen Reactions  . Guaifenesin Er Diarrhea and Nausea Only  . Morphine And Related Hives and Itching    Current Outpatient Medications:  .  acetaminophen (TYLENOL) 325 MG tablet, Take 650 mg by mouth as needed for pain., Disp: , Rfl:  .  amoxicillin (AMOXIL) 875 MG tablet, Take 1 tablet (875 mg total) by mouth 2 (two) times daily., Disp: 20 tablet, Rfl: 0 .  cholecalciferol (VITAMIN D) 1000 UNITS tablet, Take 1,000 Units by mouth daily., Disp: , Rfl:  .  fish oil-omega-3 fatty acids 1000 MG capsule, Take 1 g by mouth daily. , Disp: , Rfl:  .  gentamicin (GARAMYCIN) 0.3 % ophthalmic solution, Place 1 drop into the left eye every 4 (four) hours. While awake, for  5-7 days., Disp: 5 mL, Rfl: 0 .  Multiple Vitamins-Calcium (ONE-A-DAY WOMENS) tablet, Take 1 tablet by mouth daily.  , Disp: , Rfl:  .  pseudoephedrine-acetaminophen (TYLENOL SINUS) 30-500 MG TABS, Take 2 tablets by mouth every 4 (four) hours as needed., Disp: , Rfl:   Review of Systems  Constitutional: Negative for chills and fever.  HENT:       Left side of lower lip, hyperpigmented area, only seen by photographs sent to me by patient.   No physical exam done due to telemedicine appointment. Visual exam per photos sent by patient. Appers to look like a bruise, but should be resolving with the time period of  2-3 weeks. Patient would like referral to ENT for evaluation.  A/P Irregular lesion on inside of   lower lip (left side). Will refer to ENT for evaluation. Patient verbalizes understanding and has no questions at the end of our conversation.

## 2019-11-21 DIAGNOSIS — D100 Benign neoplasm of lip: Secondary | ICD-10-CM | POA: Diagnosis not present

## 2019-11-21 DIAGNOSIS — J301 Allergic rhinitis due to pollen: Secondary | ICD-10-CM | POA: Diagnosis not present

## 2020-01-05 DIAGNOSIS — S6981XA Other specified injuries of right wrist, hand and finger(s), initial encounter: Secondary | ICD-10-CM | POA: Diagnosis not present

## 2020-01-05 DIAGNOSIS — S63659A Sprain of metacarpophalangeal joint of unspecified finger, initial encounter: Secondary | ICD-10-CM | POA: Diagnosis not present

## 2020-01-05 DIAGNOSIS — M79644 Pain in right finger(s): Secondary | ICD-10-CM | POA: Diagnosis not present

## 2020-01-26 DIAGNOSIS — M79644 Pain in right finger(s): Secondary | ICD-10-CM | POA: Diagnosis not present

## 2020-01-26 DIAGNOSIS — S63659A Sprain of metacarpophalangeal joint of unspecified finger, initial encounter: Secondary | ICD-10-CM | POA: Diagnosis not present

## 2020-02-01 DIAGNOSIS — M79644 Pain in right finger(s): Secondary | ICD-10-CM | POA: Diagnosis not present

## 2020-02-07 DIAGNOSIS — S6991XA Unspecified injury of right wrist, hand and finger(s), initial encounter: Secondary | ICD-10-CM | POA: Diagnosis not present

## 2020-02-10 DIAGNOSIS — G8918 Other acute postprocedural pain: Secondary | ICD-10-CM | POA: Diagnosis not present

## 2020-02-10 DIAGNOSIS — X58XXXA Exposure to other specified factors, initial encounter: Secondary | ICD-10-CM | POA: Diagnosis not present

## 2020-02-10 DIAGNOSIS — Y999 Unspecified external cause status: Secondary | ICD-10-CM | POA: Diagnosis not present

## 2020-02-10 DIAGNOSIS — S63690A Other sprain of right index finger, initial encounter: Secondary | ICD-10-CM | POA: Diagnosis not present

## 2020-02-10 DIAGNOSIS — S5321XA Traumatic rupture of right radial collateral ligament, initial encounter: Secondary | ICD-10-CM | POA: Diagnosis not present

## 2020-02-10 HISTORY — PX: WRIST SURGERY: SHX841

## 2020-03-12 DIAGNOSIS — D100 Benign neoplasm of lip: Secondary | ICD-10-CM | POA: Diagnosis not present

## 2020-03-19 DIAGNOSIS — S6991XA Unspecified injury of right wrist, hand and finger(s), initial encounter: Secondary | ICD-10-CM | POA: Diagnosis not present

## 2020-03-19 DIAGNOSIS — M79644 Pain in right finger(s): Secondary | ICD-10-CM | POA: Diagnosis not present

## 2020-03-21 DIAGNOSIS — S6991XA Unspecified injury of right wrist, hand and finger(s), initial encounter: Secondary | ICD-10-CM | POA: Diagnosis not present

## 2020-04-10 DIAGNOSIS — S6991XA Unspecified injury of right wrist, hand and finger(s), initial encounter: Secondary | ICD-10-CM | POA: Diagnosis not present

## 2020-04-10 DIAGNOSIS — M79644 Pain in right finger(s): Secondary | ICD-10-CM | POA: Diagnosis not present

## 2020-04-10 DIAGNOSIS — M25641 Stiffness of right hand, not elsewhere classified: Secondary | ICD-10-CM | POA: Diagnosis not present

## 2020-04-10 DIAGNOSIS — S63659A Sprain of metacarpophalangeal joint of unspecified finger, initial encounter: Secondary | ICD-10-CM | POA: Diagnosis not present

## 2020-04-18 DIAGNOSIS — S6991XA Unspecified injury of right wrist, hand and finger(s), initial encounter: Secondary | ICD-10-CM | POA: Diagnosis not present

## 2020-04-18 DIAGNOSIS — M79644 Pain in right finger(s): Secondary | ICD-10-CM | POA: Diagnosis not present

## 2020-04-18 DIAGNOSIS — M25641 Stiffness of right hand, not elsewhere classified: Secondary | ICD-10-CM | POA: Diagnosis not present

## 2020-04-18 DIAGNOSIS — S63659A Sprain of metacarpophalangeal joint of unspecified finger, initial encounter: Secondary | ICD-10-CM | POA: Diagnosis not present

## 2020-04-25 DIAGNOSIS — S63659A Sprain of metacarpophalangeal joint of unspecified finger, initial encounter: Secondary | ICD-10-CM | POA: Diagnosis not present

## 2020-04-25 DIAGNOSIS — M79644 Pain in right finger(s): Secondary | ICD-10-CM | POA: Diagnosis not present

## 2020-04-25 DIAGNOSIS — M25641 Stiffness of right hand, not elsewhere classified: Secondary | ICD-10-CM | POA: Diagnosis not present

## 2020-04-25 DIAGNOSIS — S6991XA Unspecified injury of right wrist, hand and finger(s), initial encounter: Secondary | ICD-10-CM | POA: Diagnosis not present

## 2020-05-03 DIAGNOSIS — S6991XA Unspecified injury of right wrist, hand and finger(s), initial encounter: Secondary | ICD-10-CM | POA: Diagnosis not present

## 2020-05-03 DIAGNOSIS — M79644 Pain in right finger(s): Secondary | ICD-10-CM | POA: Diagnosis not present

## 2020-05-03 DIAGNOSIS — M25641 Stiffness of right hand, not elsewhere classified: Secondary | ICD-10-CM | POA: Diagnosis not present

## 2020-05-03 DIAGNOSIS — S63659A Sprain of metacarpophalangeal joint of unspecified finger, initial encounter: Secondary | ICD-10-CM | POA: Diagnosis not present

## 2020-05-07 DIAGNOSIS — M25641 Stiffness of right hand, not elsewhere classified: Secondary | ICD-10-CM | POA: Diagnosis not present

## 2020-05-07 DIAGNOSIS — S6991XA Unspecified injury of right wrist, hand and finger(s), initial encounter: Secondary | ICD-10-CM | POA: Diagnosis not present

## 2020-05-07 DIAGNOSIS — S63659A Sprain of metacarpophalangeal joint of unspecified finger, initial encounter: Secondary | ICD-10-CM | POA: Diagnosis not present

## 2020-05-07 DIAGNOSIS — M79644 Pain in right finger(s): Secondary | ICD-10-CM | POA: Diagnosis not present

## 2020-05-16 DIAGNOSIS — S6991XA Unspecified injury of right wrist, hand and finger(s), initial encounter: Secondary | ICD-10-CM | POA: Diagnosis not present

## 2020-05-16 DIAGNOSIS — M25641 Stiffness of right hand, not elsewhere classified: Secondary | ICD-10-CM | POA: Diagnosis not present

## 2020-05-16 DIAGNOSIS — S63659A Sprain of metacarpophalangeal joint of unspecified finger, initial encounter: Secondary | ICD-10-CM | POA: Diagnosis not present

## 2020-05-16 DIAGNOSIS — M79644 Pain in right finger(s): Secondary | ICD-10-CM | POA: Diagnosis not present

## 2020-05-21 DIAGNOSIS — S63659A Sprain of metacarpophalangeal joint of unspecified finger, initial encounter: Secondary | ICD-10-CM | POA: Diagnosis not present

## 2020-05-21 DIAGNOSIS — S6991XA Unspecified injury of right wrist, hand and finger(s), initial encounter: Secondary | ICD-10-CM | POA: Diagnosis not present

## 2020-05-21 DIAGNOSIS — M79644 Pain in right finger(s): Secondary | ICD-10-CM | POA: Diagnosis not present

## 2020-05-21 DIAGNOSIS — M25641 Stiffness of right hand, not elsewhere classified: Secondary | ICD-10-CM | POA: Diagnosis not present

## 2020-05-22 ENCOUNTER — Encounter: Payer: Self-pay | Admitting: Nurse Practitioner

## 2020-05-22 ENCOUNTER — Encounter: Payer: Self-pay | Admitting: Neurology

## 2020-05-22 ENCOUNTER — Other Ambulatory Visit: Payer: Self-pay

## 2020-05-22 ENCOUNTER — Ambulatory Visit: Payer: BC Managed Care – PPO | Admitting: Nurse Practitioner

## 2020-05-22 VITALS — BP 122/73 | HR 61 | Temp 97.3°F | Ht 64.0 in | Wt 161.2 lb

## 2020-05-22 DIAGNOSIS — R519 Headache, unspecified: Secondary | ICD-10-CM

## 2020-05-22 DIAGNOSIS — K50919 Crohn's disease, unspecified, with unspecified complications: Secondary | ICD-10-CM

## 2020-05-22 NOTE — Progress Notes (Signed)
Subjective:    Patient ID: Ruth Carpenter, female    DOB: [birth year], 60 y.o.   MRN: [id number]  HPI  59 year old female with new onset headaches. First HA was sudden and happened at the end of a softball game. 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 Denies any major stress at the time of HA onset. It was severe at the time and covered the entire top of her head and felt like there was something squeezing her head from both sides. Denies any other symptoms at the time. No dizziness, no change in LOC, was assessed by athletic trainer and sent home.   She did check her BP at home and believes it was around 138- per patient.   She does not take any regular medications. Had right hand surgery in July but has not remained on any pain medications after that she does go to PT.   Since the onset of her HA she continued to have a dull HA for three days and then had resolution. The HA returned on 05/19/20 has not been as severe but continues to be present at dull and improves with Excedrin extra strength.   She does not use caffeine regularly, would otherwise use tylenol for pain relief as needed.   Denies any noted dizziness, change in vision, loss of balance, inability to concentrate.   She has been under increased stress over the past 6 months regarding a personal matter, but does not believe that this was causing increased acute stress at the time of HA onset.   She does not drink alcohol currently denies any history of tobacco use.   Aside from current issue and hand surgery she has not been to a PCP in 5 years. She did attempt to call her PCP's office in [place] but is now considered a new patient so was unable to schedule a sick appointment.   She has not had a mammogram, has not had a colonoscopy, has not had routine bloodwork done in the past 5 years.   She has a history of Chron's has had Humera infusions in the past when living in [place]. Symptoms have since resolved and she  is nervous to have a colonscopy and go through the Prep process with concern that it may onset a Chron's flair.   Past Medical History:  Diagnosis Date  . Allergy    h/o in past--no ongoing issues  . Crohn's disease ([place])    GI in [place], [place]  . Crohn's disease ([place])   . Skin cancer, basal cell    Dr. Ubaldo Glassing (right shoulder, RLE, back, chest)   Review of Systems  Constitutional: Negative.   HENT: Negative.   Respiratory: Negative.   Cardiovascular: Negative.   Neurological: Positive for headaches. Negative for dizziness, speech difficulty, weakness and light-headedness.       Objective:   Physical Exam Constitutional:      General: She is not in acute distress.    Appearance: She is well-developed.  Eyes:     General: No visual field deficit.    Extraocular Movements: Extraocular movements intact.     Pupils: Pupils are equal, round, and reactive to light.  Cardiovascular:     Rate and Rhythm: Normal rate and regular rhythm.     Heart sounds: Normal heart sounds.  Pulmonary:     Effort: Pulmonary effort is normal.     Breath sounds: Normal breath sounds.  Musculoskeletal:     Cervical  back: Normal range of motion.  Skin:    General: Skin is warm and dry.  Neurological:     Mental Status: She is alert and oriented to person, place, and time.     Cranial Nerves: No cranial nerve deficit or facial asymmetry.     Sensory: Sensation is intact. No sensory deficit.     Motor: No weakness or pronator drift.     Coordination: Romberg sign positive. Coordination normal.     Gait: Tandem walk abnormal. Gait normal.     Deep Tendon Reflexes: Reflexes normal.     Comments: Slightly unbalanced with heel to toe. Rhomberg with swaying present.   Psychiatric:        Speech: Speech normal.        Behavior: Behavior normal.           Assessment & Plan:  Advised patient referral to Neurology due to acute onset of HA without other significant medical history. Headache type is  no consistent with migraines. Neuro deficit on exam, prefer referral and imaging at the discretion of Neurologist.   Patient advised that she may continue Excedrin as needed until appointment with Neurology.   Referred patient to call counseling services and set up an appointment to further discuss personal issue and manage stress related to that.  She will return to clinic for a lab draw of executive panel that will be forwarded to PCP with follow up for annual physical with that office. She will decide to re establish with old PCP, or call [phone] number given with a list of [facility] PCPs in the region that are taking new patients.   She will call to schedule a Mammogram with [facility] with [facility], number given.  We will refer her to GI to have consultation prior to Colonoscopy given history of Chron's.  Patient to return to clinic with any new concerns/worsening HA or uncontrolled HA as discussed while awaiting appointment with Neurology.

## 2020-05-22 NOTE — Patient Instructions (Signed)
   Call [phone] number OR use [facility] to search for a Primary care doctor   Call [phone] to schedule Mammogram  Referring provider can be Apolonio Schneiders NP   We will refer you to Gastroenterology and Neurology and call you with more information on this  Call counseling services to set up an appointment   Continue to use Excedrin for headache control until seen by neurology   Flu shot given today   Return for bloodwork call [facility] for appointment

## 2020-05-24 ENCOUNTER — Other Ambulatory Visit: Payer: Self-pay | Admitting: Nurse Practitioner

## 2020-05-29 DIAGNOSIS — S63659A Sprain of metacarpophalangeal joint of unspecified finger, initial encounter: Secondary | ICD-10-CM | POA: Diagnosis not present

## 2020-05-29 DIAGNOSIS — M79644 Pain in right finger(s): Secondary | ICD-10-CM | POA: Diagnosis not present

## 2020-05-29 DIAGNOSIS — S6991XA Unspecified injury of right wrist, hand and finger(s), initial encounter: Secondary | ICD-10-CM | POA: Diagnosis not present

## 2020-05-29 DIAGNOSIS — M25641 Stiffness of right hand, not elsewhere classified: Secondary | ICD-10-CM | POA: Diagnosis not present

## 2020-05-30 ENCOUNTER — Other Ambulatory Visit: Payer: Self-pay

## 2020-05-30 ENCOUNTER — Other Ambulatory Visit: Payer: BC Managed Care – PPO

## 2020-05-30 DIAGNOSIS — Z0000 Encounter for general adult medical examination without abnormal findings: Secondary | ICD-10-CM

## 2020-05-30 LAB — POCT URINALYSIS DIPSTICK
Bilirubin, UA: NEGATIVE
Blood, UA: NEGATIVE
Glucose, UA: NEGATIVE
Ketones, UA: NEGATIVE
Leukocytes, UA: NEGATIVE
Nitrite, UA: NEGATIVE
Protein, UA: NEGATIVE
Spec Grav, UA: 1.01 (ref 1.010–1.025)
Urobilinogen, UA: 0.2 E.U./dL
pH, UA: 6 (ref 5.0–8.0)

## 2020-05-30 NOTE — Addendum Note (Signed)
Addended by: Angad Nabers, Nira Conn R on: 05/30/2020 10:34 AM   Modules accepted: Orders

## 2020-05-30 NOTE — Progress Notes (Signed)
Labs ordered per Apolonio Schneiders ( per her note).

## 2020-05-31 ENCOUNTER — Telehealth: Payer: Self-pay | Admitting: Nurse Practitioner

## 2020-05-31 LAB — CMP12+LP+TP+TSH+6AC+CBC/D/PLT
ALT: 30 IU/L (ref 0–32)
AST: 27 IU/L (ref 0–40)
Albumin/Globulin Ratio: 2.1 (ref 1.2–2.2)
Albumin: 4.7 g/dL (ref 3.8–4.9)
Alkaline Phosphatase: 59 IU/L (ref 44–121)
BUN/Creatinine Ratio: 14 (ref 12–28)
BUN: 12 mg/dL (ref 8–27)
Basophils Absolute: 0 10*3/uL (ref 0.0–0.2)
Basos: 1 %
Bilirubin Total: 0.9 mg/dL (ref 0.0–1.2)
Calcium: 10 mg/dL (ref 8.7–10.3)
Chloride: 103 mmol/L (ref 96–106)
Chol/HDL Ratio: 3.3 ratio (ref 0.0–4.4)
Cholesterol, Total: 212 mg/dL — ABNORMAL HIGH (ref 100–199)
Creatinine, Ser: 0.86 mg/dL (ref 0.57–1.00)
EOS (ABSOLUTE): 0.1 10*3/uL (ref 0.0–0.4)
Eos: 1 %
Estimated CHD Risk: <0.5 times avg. (ref 0.0–1.0)
Free Thyroxine Index: 2.3 (ref 1.2–4.9)
GFR calc Af Amer: 85 mL/min/{1.73_m2} (ref >59)
GFR calc non Af Amer: 74 mL/min/{1.73_m2} (ref >59)
GGT: 35 IU/L (ref 0–60)
Globulin, Total: 2.2 g/dL (ref 1.5–4.5)
Glucose: 82 mg/dL (ref 65–99)
HDL: 64 mg/dL (ref >39)
Hematocrit: 39.9 % (ref 34.0–46.6)
Hemoglobin: 13.8 g/dL (ref 11.1–15.9)
Immature Grans (Abs): 0 10*3/uL (ref 0.0–0.1)
Immature Granulocytes: 0 %
Iron: 81 ug/dL (ref 27–159)
LDH: 209 IU/L (ref 119–226)
LDL Chol Calc (NIH): 122 mg/dL — ABNORMAL HIGH (ref 0–99)
Lymphocytes Absolute: 1.5 10*3/uL (ref 0.7–3.1)
Lymphs: 35 %
MCH: 30.6 pg (ref 26.6–33.0)
MCHC: 34.6 g/dL (ref 31.5–35.7)
MCV: 89 fL (ref 79–97)
Monocytes Absolute: 0.3 10*3/uL (ref 0.1–0.9)
Monocytes: 8 %
Neutrophils Absolute: 2.3 10*3/uL (ref 1.4–7.0)
Neutrophils: 55 %
Phosphorus: 3.1 mg/dL (ref 3.0–4.3)
Platelets: 224 10*3/uL (ref 150–450)
Potassium: 4.3 mmol/L (ref 3.5–5.2)
RBC: 4.51 x10E6/uL (ref 3.77–5.28)
RDW: 12.1 % (ref 11.7–15.4)
Sodium: 141 mmol/L (ref 134–144)
T3 Uptake Ratio: 27 % (ref 24–39)
T4, Total: 8.5 ug/dL (ref 4.5–12.0)
TSH: 1.74 u[IU]/mL (ref 0.450–4.500)
Total Protein: 6.9 g/dL (ref 6.0–8.5)
Triglycerides: 149 mg/dL (ref 0–149)
Uric Acid: 4.3 mg/dL (ref 3.0–7.2)
VLDL Cholesterol Cal: 26 mg/dL (ref 5–40)
WBC: 4.2 10*3/uL (ref 3.4–10.8)

## 2020-05-31 LAB — VITAMIN D 25 HYDROXY (VIT D DEFICIENCY, FRACTURES): Vit D, 25-Hydroxy: 57.4 ng/mL (ref 30.0–100.0)

## 2020-05-31 NOTE — Telephone Encounter (Signed)
Reviewed labs with patient over the phone, encouraged establishing with PCP.   Will follow up regarding GI referral  She is scheduled to see Neurology in December  She will schedule her own Mammogram   RTC if HA worsens prior to Neuro appointment, as of today headache is dull and manageable has not had another acute/worsening episode since last visit.

## 2020-06-01 ENCOUNTER — Other Ambulatory Visit: Payer: BC Managed Care – PPO

## 2020-06-04 ENCOUNTER — Other Ambulatory Visit: Payer: Self-pay

## 2020-06-04 ENCOUNTER — Encounter: Payer: Self-pay | Admitting: Neurology

## 2020-06-04 ENCOUNTER — Ambulatory Visit: Payer: BC Managed Care – PPO | Admitting: Neurology

## 2020-06-04 ENCOUNTER — Other Ambulatory Visit: Payer: Self-pay | Admitting: Neurology

## 2020-06-04 VITALS — BP 114/80 | HR 74 | Resp 18 | Ht 67.5 in | Wt 161.0 lb

## 2020-06-04 DIAGNOSIS — R519 Headache, unspecified: Secondary | ICD-10-CM | POA: Diagnosis not present

## 2020-06-04 MED ORDER — DIAZEPAM 5 MG PO TABS: ORAL_TABLET | ORAL | 0 refills | Status: DC

## 2020-06-04 NOTE — Progress Notes (Signed)
NEUROLOGY CONSULTATION NOTE  Ruth Carpenter MRN: [id number] DOB: 1960/09/01  Referring provider: Apolonio Schneiders, FNP Primary care provider: Apolonio Schneiders, FNP  Reason for consult:  headaches  HISTORY OF PRESENT ILLNESS: Ruth Carpenter is a 59 year old right-handed female with Crohn's disease who presents for new onset headaches.  History supplemented by referring provider's note.  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  She had to keep still as any movement aggravated it.  Maybe some mild dizziness.  No associated visual disturbance, nausea, vomiting, speech disturbance, photophobia, phonophobia, numbness or weakness.  When she got home, she checked her blood pressure which was 010 systolic.  The severe headache lasted a couple of hours.  She went to sleep and was in bed until the following afternoon.  She had a dull headache for 2 to 3 days.  Since then, she has had two other similar headaches.  She has had these headaches since 66 or 59 years old.  However, they typically wake her up in the early morning and occur no more than once a month.  She treats with Tylenol or sometimes Excedrin.  She has had 3 headaches over the past month.  She reports some new stressors but nothing significant.  She denies any change in sleep or eating schedule.  No new medications or head trauma.  Current medications:  Tylenol, fish oil, D, Sometimes benadryl at night.  No known family history of headache.  05/30/2020 LABS:  CBC with WBC 4.2, HGB 13.8, HCT 39.9, PLT 224; CMP with Na 141, K 4.3, Cl 103, Ca 10, glucose 81, BUN 12, Cr 0.86, t bili 0.9, Alk 59, AST 27, ALT 30; TSH 1.740  PAST MEDICAL HISTORY: Past Medical History:  Diagnosis Date  . Allergy    h/o in past--no ongoing issues  . Crohn's disease ([place])    GI in [place], [place]  . Crohn's disease ([place])   . Skin cancer, basal cell    Dr. Ubaldo Glassing (right  shoulder, RLE, back, chest)    PAST SURGICAL HISTORY: Past Surgical History:  Procedure Laterality Date  . DERMOID CYST  EXCISION Right age 89's   with R ovarectomy  . KNEE ARTHROSCOPY Bilateral    meniscus tears  . ROTATOR CUFF REPAIR Left 05/2012   bicep release, rotator cuff and labrum repair ([facility]; Dr. Gerald Dexter)  . salivary gland excision      MEDICATIONS: Current Outpatient Medications on File Prior to Visit  Medication Sig Dispense Refill  . acetaminophen (TYLENOL) 325 MG tablet Take 650 mg by mouth as needed for pain.    Marland Kitchen amoxicillin (AMOXIL) 875 MG tablet Take 1 tablet (875 mg total) by mouth 2 (two) times daily. (Patient not taking: Reported on 05/22/2020) 20 tablet 0  . cholecalciferol (VITAMIN D) 1000 UNITS tablet Take 1,000 Units by mouth daily.    . fish oil-omega-3 fatty acids 1000 MG capsule Take 1 g by mouth daily.     Marland Kitchen gentamicin (GARAMYCIN) 0.3 % ophthalmic solution Place 1 drop into the left eye every 4 (four) hours. While awake, for  5-7 days. (Patient not taking: Reported on 05/22/2020) 5 mL 0  . Multiple Vitamins-Calcium (ONE-A-DAY WOMENS) tablet Take 1 tablet by mouth daily.      . pseudoephedrine-acetaminophen (TYLENOL SINUS) 30-500 MG TABS Take 2 tablets by mouth every 4 (four) hours as needed.     No current facility-administered medications on file prior to visit.  ALLERGIES: Allergies  Allergen Reactions  . Guaifenesin Er Diarrhea and Nausea Only  . Morphine And Related Hives and Itching    FAMILY HISTORY: Family History  Problem Relation Age of Onset  . Heart disease Mother        CABG at 42; smoker  . Diabetes Mother   . Hypertension Mother   . Heart disease Father 71       died of massive MI at 53  . Hypertension Father   . Hyperlipidemia Sister   . Hypertension Sister   . Diabetes Sister   . Hyperlipidemia Brother   . Hypertension Brother   . Diabetes Brother   . Cancer Maternal Aunt        ?type  . Cancer Maternal Grandmother         ?type  . Hyperlipidemia Brother   . Hypertension Brother   . Hyperlipidemia Brother   . Hypertension Brother   . Hyperlipidemia Sister   . Hypertension Sister   . Diabetes Other     SOCIAL HISTORY: Social History   Socioeconomic History  . Marital status: Single    Spouse name: Not on file  . Number of children: Not on file  . Years of education: Not on file  . Highest education level: Not on file  Occupational History  . Occupation: Chemical engineer: [employer]  Tobacco Use  . Smoking status: Never Smoker  . Smokeless tobacco: Never Used  Substance and Sexual Activity  . Alcohol use: No    Comment: no drinks over 7 years.   . Drug use: No  . Sexual activity: Not Currently  Other Topics Concern  . Not on file  Social History Narrative   Lives with a roommate [name]), 2 [place]agles   Social Determinants of Health   Financial Resource Strain:   . Difficulty of Paying Living Expenses: Not on file  Food Insecurity:   . Worried About Charity fundraiser in the Last Year: Not on file  . Ran Out of Food in the Last Year: Not on file  Transportation Needs:   . Lack of Transportation (Medical): Not on file  . Lack of Transportation (Non-Medical): Not on file  Physical Activity:   . Days of Exercise per Week: Not on file  . Minutes of Exercise per Session: Not on file  Stress:   . Feeling of Stress : Not on file  Social Connections:   . Frequency of Communication with Friends and Family: Not on file  . Frequency of Social Gatherings with Friends and Family: Not on file  . Attends Religious Services: Not on file  . Active Member of Clubs or Organizations: Not on file  . Attends Archivist Meetings: Not on file  . Marital Status: Not on file  Intimate Partner Violence:   . Fear of Current or Ex-Partner: Not on file  . Emotionally Abused: Not on file  . Physically Abused: Not on file  . Sexually Abused: Not on file    PHYSICAL  EXAM: Blood pressure 114/80, pulse 74, resp. rate 18, height 5' 7.5" (1.715 m), weight 161 lb (73 kg), SpO2 98 %. General: No acute distress.  Patient appears well-groomed.  Head:  Normocephalic/atraumatic Eyes:  fundi examined but not visualized Neck: supple, no paraspinal tenderness, full range of motion Back: No paraspinal tenderness Heart: regular rate and rhythm Lungs: Clear to auscultation bilaterally. Vascular: No carotid bruits. Neurological Exam: Mental status: alert and oriented to  person, place, and time, recent and remote memory intact, fund of knowledge intact, attention and concentration intact, speech fluent and not dysarthric, language intact. Cranial nerves: CN I: not tested CN II: pupils equal, round and reactive to light, visual fields intact CN III, IV, VI:  full range of motion, no nystagmus, no ptosis CN V: facial sensation intact CN VII: upper and lower face symmetric CN VIII: hearing intact CN IX, X: gag intact, uvula midline CN XI: sternocleidomastoid and trapezius muscles intact CN XII: tongue midline Bulk & Tone: normal, no fasciculations. Motor:  5/5 throughout  Sensation: temperature and vibration sensation intact. Deep Tendon Reflexes:  2+ throughout, toes downgoing.  Finger to nose testing:  Without dysmetria.  Heel to shin:  Without dysmetria.  Gait:  Normal station and stride.  Able to turn and tandem walk. Romberg negative.  IMPRESSION: 1.  Severe headache.  Unclear etiology.  Possibly thunderclap headache  PLAN: 1.  MRI of brain and MRA of head and neck to rule out secondary etiology such as mass lesion, cerebral aneurysm or arterial dissection. 2.  Defers medication at this time.  May use Tylenol as needed. 3.  Follow up after testing.  Thank you for allowing me to take part in the care of this patient.  Metta Clines, DO

## 2020-06-04 NOTE — Patient Instructions (Addendum)
1.  Will check MRI of brain without contrast and MRA of head and neck. Take diazepam 40 to 60 minutes prior to MRI.  Must have a driver 2.  Follow up 4 to 6 months.  We have sent a referral to [facility] for your MRI and they will call you directly to schedule your appointment. They are located at [place]. If you need to contact them directly please call [phone].

## 2020-06-13 ENCOUNTER — Encounter: Payer: Self-pay | Admitting: Physician Assistant

## 2020-06-14 ENCOUNTER — Other Ambulatory Visit: Payer: Self-pay | Admitting: Neurology

## 2020-06-14 ENCOUNTER — Other Ambulatory Visit: Payer: Self-pay

## 2020-06-14 DIAGNOSIS — R519 Headache, unspecified: Secondary | ICD-10-CM

## 2020-06-18 ENCOUNTER — Other Ambulatory Visit: Payer: Self-pay | Admitting: Neurology

## 2020-06-18 ENCOUNTER — Telehealth: Payer: Self-pay | Admitting: Neurology

## 2020-06-18 DIAGNOSIS — S63659A Sprain of metacarpophalangeal joint of unspecified finger, initial encounter: Secondary | ICD-10-CM | POA: Diagnosis not present

## 2020-06-18 DIAGNOSIS — M79644 Pain in right finger(s): Secondary | ICD-10-CM | POA: Diagnosis not present

## 2020-06-18 DIAGNOSIS — S6991XA Unspecified injury of right wrist, hand and finger(s), initial encounter: Secondary | ICD-10-CM | POA: Diagnosis not present

## 2020-06-18 DIAGNOSIS — M25641 Stiffness of right hand, not elsewhere classified: Secondary | ICD-10-CM | POA: Diagnosis not present

## 2020-06-18 MED ORDER — DIAZEPAM 5 MG PO TABS: ORAL_TABLET | ORAL | 0 refills | Status: DC

## 2020-06-18 NOTE — Telephone Encounter (Signed)
Patient is scheduled for MRI on two different days and is requesting another diazepam for "anxiety" for the second MRI appointment. She said they could not do both tests on the same day.  [facility] on [address]

## 2020-06-18 NOTE — Telephone Encounter (Signed)
done

## 2020-06-25 DIAGNOSIS — S63659A Sprain of metacarpophalangeal joint of unspecified finger, initial encounter: Secondary | ICD-10-CM | POA: Diagnosis not present

## 2020-06-25 DIAGNOSIS — M79644 Pain in right finger(s): Secondary | ICD-10-CM | POA: Diagnosis not present

## 2020-06-25 DIAGNOSIS — S6991XA Unspecified injury of right wrist, hand and finger(s), initial encounter: Secondary | ICD-10-CM | POA: Diagnosis not present

## 2020-06-25 DIAGNOSIS — M25641 Stiffness of right hand, not elsewhere classified: Secondary | ICD-10-CM | POA: Diagnosis not present

## 2020-06-26 ENCOUNTER — Ambulatory Visit
Admission: RE | Admit: 2020-06-26 | Discharge: 2020-06-26 | Disposition: A | Discharge status: Home / Self Care | Payer: BC Managed Care – PPO | Source: Ambulatory Visit | Attending: Neurology | Admitting: Neurology

## 2020-06-26 ENCOUNTER — Other Ambulatory Visit: Payer: BC Managed Care – PPO

## 2020-06-26 ENCOUNTER — Other Ambulatory Visit: Payer: Self-pay

## 2020-06-26 DIAGNOSIS — R519 Headache, unspecified: Secondary | ICD-10-CM

## 2020-06-26 DIAGNOSIS — S63659A Sprain of metacarpophalangeal joint of unspecified finger, initial encounter: Secondary | ICD-10-CM | POA: Diagnosis not present

## 2020-06-26 DIAGNOSIS — S6991XA Unspecified injury of right wrist, hand and finger(s), initial encounter: Secondary | ICD-10-CM | POA: Diagnosis not present

## 2020-06-26 DIAGNOSIS — M79644 Pain in right finger(s): Secondary | ICD-10-CM | POA: Diagnosis not present

## 2020-06-26 MED ORDER — GADOBENATE DIMEGLUMINE 529 MG/ML IV SOLN
15.0000 mL | Freq: Once | INTRAVENOUS | Status: AC | PRN
  Administered 2020-06-26: 15 mL via INTRAVENOUS

## 2020-06-27 ENCOUNTER — Other Ambulatory Visit: Payer: BC Managed Care – PPO

## 2020-06-27 ENCOUNTER — Other Ambulatory Visit (INDEPENDENT_AMBULATORY_CARE_PROVIDER_SITE_OTHER): Payer: BC Managed Care – PPO

## 2020-06-27 ENCOUNTER — Encounter: Payer: Self-pay | Admitting: Gastroenterology

## 2020-06-27 ENCOUNTER — Other Ambulatory Visit: Payer: Self-pay | Admitting: Neurology

## 2020-06-27 ENCOUNTER — Encounter: Payer: Self-pay | Admitting: Physician Assistant

## 2020-06-27 ENCOUNTER — Ambulatory Visit: Payer: BC Managed Care – PPO | Admitting: Physician Assistant

## 2020-06-27 VITALS — BP 120/86 | HR 78 | Ht 67.5 in | Wt 161.0 lb

## 2020-06-27 DIAGNOSIS — R519 Headache, unspecified: Secondary | ICD-10-CM

## 2020-06-27 DIAGNOSIS — Z1211 Encounter for screening for malignant neoplasm of colon: Secondary | ICD-10-CM

## 2020-06-27 DIAGNOSIS — Z8719 Personal history of other diseases of the digestive system: Secondary | ICD-10-CM | POA: Diagnosis not present

## 2020-06-27 LAB — CBC WITH DIFFERENTIAL/PLATELET
Basophils Absolute: 0 10*3/uL (ref 0.0–0.1)
Basophils Relative: 0.8 % (ref 0.0–3.0)
Eosinophils Absolute: 0.1 10*3/uL (ref 0.0–0.7)
Eosinophils Relative: 0.9 % (ref 0.0–5.0)
HCT: 41.2 % (ref 36.0–46.0)
Hemoglobin: 13.6 g/dL (ref 12.0–15.0)
Lymphocytes Relative: 33 % (ref 12.0–46.0)
Lymphs Abs: 2 10*3/uL (ref 0.7–4.0)
MCHC: 33.1 g/dL (ref 30.0–36.0)
MCV: 87.5 fl (ref 78.0–100.0)
Monocytes Absolute: 0.4 10*3/uL (ref 0.1–1.0)
Monocytes Relative: 6 % (ref 3.0–12.0)
Neutro Abs: 3.6 10*3/uL (ref 1.4–7.7)
Neutrophils Relative %: 59.3 % (ref 43.0–77.0)
Platelets: 238 10*3/uL (ref 150.0–400.0)
RBC: 4.7 Mil/uL (ref 3.87–5.11)
RDW: 13.4 % (ref 11.5–15.5)
WBC: 6.1 10*3/uL (ref 4.0–10.5)

## 2020-06-27 LAB — C-REACTIVE PROTEIN: CRP: <1 mg/dL (ref 0.5–20.0)

## 2020-06-27 MED ORDER — PLENVU 140 G PO SOLR
1.0000 | ORAL | 0 refills | Status: DC
Start: 2020-06-27 — End: 2020-06-29

## 2020-06-27 NOTE — Patient Instructions (Addendum)
If you are age 60 or older, your body mass index should be between 23-30. Your Body mass index is 24.84 kg/m. If this is out of the aforementioned range listed, please consider follow up with your Primary Care Provider.  If you are age 20 or younger, your body mass index should be between 19-25. Your Body mass index is 24.84 kg/m. If this is out of the aformentioned range listed, please consider follow up with your Primary Care Provider.   Your provider has requested that you go to the basement level for lab work before leaving today. Press "B" on the elevator. The lab is located at the first door on the left as you exit the elevator.  You have been scheduled for a colonoscopy. Please follow written instructions given to you at your visit today.  Please pick up your prep supplies at the pharmacy within the next 1-3 days. If you use inhalers (even only as needed), please bring them with you on the day of your procedure.  Follow up pending the results of your Colonoscopy and labs or as needed

## 2020-06-27 NOTE — Progress Notes (Signed)
Subjective:    Patient ID: Ruth Carpenter, female    DOB: 1960-08-25, 59 y.o.   MRN: [id number]  HPI Ruth Carpenter is a pleasant 59 year old white female, new to GI today referred by Apolonio Schneiders, FNP for consideration of colonoscopy.  Patient is in generally good health. She does have history of Crohn's disease which she says was diagnosed by Dr. West Carbo in [place] in the 2002-2003.  She was initiated on Remicade and said she participated in a trial study with Remicade at that time and believes that she took Remicade for about a year.  She had eventually moved and was not having any GI symptoms and stopped taking Remicade.  She has not had any GI evaluation since and has not had any ongoing GI symptoms.  She is not certain whether Crohn's was in her colon or small bowel but believes it may have been small intestine. She has no current complaints of abdominal pain or cramping, no recent changes in bowel habits, will have an occasional episode of diarrhea.  No melena or hematochezia.  Occasionally will feel some fullness in her abdomen.  Appetite has been fine, weight has been stable. Family history is negative for colon cancer or polyps as far she is aware. She did have some recent baseline labs done with chemistries which were unremarkable.  Review of Systems Pertinent positive and negative review of systems were noted in the above HPI section.  All other review of systems was otherwise negative.  Outpatient Encounter Medications as of 06/27/2020  Medication Sig  . cholecalciferol (VITAMIN D) 1000 UNITS tablet Take 1,000 Units by mouth daily.  . fish oil-omega-3 fatty acids 1000 MG capsule Take 1 g by mouth daily.   . Multiple Vitamins-Calcium (ONE-A-DAY WOMENS) tablet Take 1 tablet by mouth daily.    Marland Kitchen acetaminophen (TYLENOL) 325 MG tablet Take 650 mg by mouth as needed for pain.  . diazepam (VALIUM) 5 MG tablet Take 1 tablet 40 to 60 minutes prior to MRI  . PEG-KCl-NaCl-NaSulf-Na Asc-C  (PLENVU) 140 g SOLR Take 1 kit by mouth as directed. Manufacturer's coupon Universal coupon code:BIN: P2366821; GROUP: [id number]; PCN: [id number]; ID: [id number]; PAY NO MORE $50; NO prior authorization  . [DISCONTINUED] amoxicillin (AMOXIL) 875 MG tablet Take 1 tablet (875 mg total) by mouth 2 (two) times daily. (Patient not taking: Reported on 06/27/2020)  . [DISCONTINUED] gentamicin (GARAMYCIN) 0.3 % ophthalmic solution Place 1 drop into the left eye every 4 (four) hours. While awake, for  5-7 days. (Patient not taking: Reported on 06/27/2020)  . [DISCONTINUED] pseudoephedrine-acetaminophen (TYLENOL SINUS) 30-500 MG TABS Take 2 tablets by mouth every 4 (four) hours as needed. (Patient not taking: Reported on 06/27/2020)   No facility-administered encounter medications on file as of 06/27/2020.   Allergies  Allergen Reactions  . Guaifenesin Er Diarrhea and Nausea Only  . Morphine And Related Hives and Itching   Patient Active Problem List   Diagnosis Date Noted  . Primary osteoarthritis of right hip 03/29/2015  . Crohn's disease ([facility]) 05/26/2013  . Adhesive capsulitis of left shoulder 06/01/2012  . Left shoulder pain 06/01/2012  . Baker's cyst of knee 10/29/2011  . Calcific tendonitis 10/29/2011   Social History   Socioeconomic History  . Marital status: Single    Spouse name: Not on file  . Number of children: Not on file  . Years of education: Not on file  . Highest education level: Not on file  Occupational History  . Occupation: Consulting civil engineer  Employer: [employer]  Tobacco Use  . Smoking status: Never Smoker  . Smokeless tobacco: Never Used  Substance and Sexual Activity  . Alcohol use: No    Comment: no drinks over 7 years.   . Drug use: No  . Sexual activity: Not Currently  Other Topics Concern  . Not on file  Social History Narrative   Lives with a roommate [name]), 2 [place]agles   Social Determinants of Health   Financial Resource Strain:   . Difficulty  of Paying Living Expenses: Not on file  Food Insecurity:   . Worried About Charity fundraiser in the Last Year: Not on file  . Ran Out of Food in the Last Year: Not on file  Transportation Needs:   . Lack of Transportation (Medical): Not on file  . Lack of Transportation (Non-Medical): Not on file  Physical Activity:   . Days of Exercise per Week: Not on file  . Minutes of Exercise per Session: Not on file  Stress:   . Feeling of Stress : Not on file  Social Connections:   . Frequency of Communication with Friends and Family: Not on file  . Frequency of Social Gatherings with Friends and Family: Not on file  . Attends Religious Services: Not on file  . Active Member of Clubs or Organizations: Not on file  . Attends Archivist Meetings: Not on file  . Marital Status: Not on file  Intimate Partner Violence:   . Fear of Current or Ex-Partner: Not on file  . Emotionally Abused: Not on file  . Physically Abused: Not on file  . Sexually Abused: Not on file    Ruth Carpenter's family history includes Cancer in her maternal aunt and maternal grandmother; Diabetes in her brother, mother, sister, and another family member; Heart disease in her mother; Heart disease (age of onset: 25) in her father; Hyperlipidemia in her brother, brother, brother, sister, and sister; Hypertension in her brother, brother, brother, father, mother, sister, and sister.      Objective:    Vitals:   06/27/20 1125  BP: 120/86  Pulse: 78  SpO2: 96%    Physical Exam Well-developed well-nourished WF in no acute distress.  Height, [id number], BMI 24.8  HEENT; nontraumatic normocephalic, EOMI, PE RR LA, sclera anicteric. Oropharynx; not done Neck; supple, no JVD Cardiovascular; regular rate and rhythm with S1-S2, no murmur rub or gallop Pulmonary; Clear bilaterally Abdomen; soft, nontender, nondistended, no palpable mass or hepatosplenomegaly, bowel sounds are active Rectal; not done today Skin;  benign exam, no jaundice rash or appreciable lesions Extremities; no clubbing cyanosis or edema skin warm and dry Neuro/Psych; alert and oriented x4, grossly nonfocal mood and affect appropriate      Assessment & Plan:   #20  59 year old white female with remote history of Crohn's disease diagnosed around 2002 and treated for about 1 year with Remicade. Patient has not had any ongoing GI issues since that time and has not had any further GI evaluation. It sounds as if her Crohn's has been in remission, she needs colonoscopy for colon cancer screening.  Plan; CBC with differential, CRP Patient will be scheduled for colonoscopy with Dr. Havery Moros.  Procedure was discussed in detail with patient including indications risks and benefits and she is agreeable to proceed. Patient has completed COVID-19 vaccination. Patient is trying to get a hold of her previous colonoscopy records. Further recommendations pending findings at colonoscopy.  Kennie Snedden Genia Harold PA-C 06/27/2020   Cc: Jerline Pain,  Judson Roch, FNP

## 2020-06-28 NOTE — Progress Notes (Signed)
Agree with assessment and plan as outlined.  

## 2020-06-29 ENCOUNTER — Ambulatory Visit (AMBULATORY_SURGERY_CENTER): Payer: BC Managed Care – PPO | Admitting: Gastroenterology

## 2020-06-29 ENCOUNTER — Encounter: Payer: Self-pay | Admitting: Gastroenterology

## 2020-06-29 ENCOUNTER — Telehealth: Payer: Self-pay | Admitting: Neurology

## 2020-06-29 ENCOUNTER — Other Ambulatory Visit: Payer: Self-pay

## 2020-06-29 VITALS — BP 106/72 | HR 64 | Temp 98.0°F | Resp 18 | Ht 69.0 in | Wt 161.0 lb

## 2020-06-29 DIAGNOSIS — D123 Benign neoplasm of transverse colon: Secondary | ICD-10-CM | POA: Diagnosis not present

## 2020-06-29 DIAGNOSIS — Z1211 Encounter for screening for malignant neoplasm of colon: Secondary | ICD-10-CM | POA: Diagnosis not present

## 2020-06-29 DIAGNOSIS — K6389 Other specified diseases of intestine: Secondary | ICD-10-CM

## 2020-06-29 DIAGNOSIS — K5090 Crohn's disease, unspecified, without complications: Secondary | ICD-10-CM | POA: Diagnosis not present

## 2020-06-29 DIAGNOSIS — D120 Benign neoplasm of cecum: Secondary | ICD-10-CM

## 2020-06-29 MED ORDER — SODIUM CHLORIDE 0.9 % IV SOLN
500.0000 mL | Freq: Once | INTRAVENOUS | Status: DC
Start: 2020-06-29 — End: 2020-06-29

## 2020-06-29 NOTE — Progress Notes (Signed)
Report to PACU, RN, vss, BBS= Clear.  

## 2020-06-29 NOTE — Op Note (Signed)
[facility] Patient Name: Ruth Carpenter Procedure Date: 06/29/2020 2:37 PM MRN: [id number] Endoscopist: Remo Lipps P. Havery Moros , MD Age: 59 Referring MD:  Date of Birth: 20-Nov-1960 Gender: Female Account #: [id number] Procedure:                Colonoscopy Indications:              Screening for colorectal malignant neoplasm, remote                            history of Crohn's disease in 2004 - on Remicade                            for one year and then not on anything since then,                            asymptomatic Medicines:                Monitored Anesthesia Care Procedure:                Pre-Anesthesia Assessment:                           - Prior to the procedure, a History and Physical                            was performed, and patient medications and                            allergies were reviewed. The patient's tolerance of                            previous anesthesia was also reviewed. The risks                            and benefits of the procedure and the sedation                            options and risks were discussed with the patient.                            All questions were answered, and informed consent                            was obtained. Prior Anticoagulants: The patient has                            taken no previous anticoagulant or antiplatelet                            agents. ASA Grade Assessment: II - A patient with                            mild systemic disease. After reviewing the risks  and benefits, the patient was deemed in                            satisfactory condition to undergo the procedure.                           After obtaining informed consent, the colonoscope                            was passed under direct vision. Throughout the                            procedure, the patient's blood pressure, pulse, and                            oxygen saturations were monitored  continuously. The                            Colonoscope was introduced through the anus and                            advanced to the the terminal ileum, with                            identification of the appendiceal orifice and IC                            valve. The colonoscopy was performed without                            difficulty. The patient tolerated the procedure                            well. The quality of the bowel preparation was                            good. The terminal ileum, ileocecal valve,                            appendiceal orifice, and rectum were photographed. Scope In: 2:43:28 PM Scope Out: 3:02:14 PM Scope Withdrawal Time: 0 hours 15 minutes 5 seconds  Total Procedure Duration: 0 hours 18 minutes 46 seconds  Findings:                 The perianal and digital rectal examinations were                            normal.                           The terminal ileum appeared normal.                           A 3 mm polyp was found in the ileocecal valve. The  polyp was sessile. The polyp was removed with a                            cold biopsy forceps. Resection and retrieval were                            complete.                           A 3 mm polyp was found in the transverse colon. The                            polyp was sessile. The polyp was removed with a                            cold snare. Resection and retrieval were complete.                           A 4 to 5 mm polyp was found in the transverse                            colon. The polyp was sessile. The polyp was removed                            with a cold snare. Resection and retrieval were                            complete.                           Mild melanosis coli was found in the entire colon.                           Internal hemorrhoids were found during                            retroflexion. The hemorrhoids were small.                            The exam was otherwise without abnormality. No                            overt inflammation in regards to remote history of                            Crohn's disease.                           Biopsies were taken with a cold forceps for                            histology from the right, transverse, and left  colon to ensure no subtle active colitis /                            dysplasia given reported history of remote Crohn's                            disease.. Complications:            No immediate complications. Estimated blood loss:                            Minimal. Estimated Blood Loss:     Estimated blood loss was minimal. Impression:               - The examined portion of the ileum was normal.                           - One 3 mm polyp at the ileocecal valve, removed                            with a cold biopsy forceps. Resected and retrieved.                           - One 3 mm polyp in the transverse colon, removed                            with a cold snare. Resected and retrieved.                           - One 4 to 5 mm polyp in the transverse colon,                            removed with a cold snare. Resected and retrieved.                           - Melanosis coli.                           - Internal hemorrhoids.                           - The examination was otherwise normal. No overt                            inflammation                           - Biopsies were taken with a cold forceps for                            histology. Recommendation:           - Patient has a contact number available for                            emergencies. The signs and symptoms of  potential                            delayed complications were discussed with the                            patient. Return to normal activities tomorrow.                            Written discharge instructions were provided to the                             patient.                           - Resume previous diet.                           - Continue present medications.                           - Await pathology results. Remo Lipps P. Walta [place], MD 06/29/2020 3:09:23 PM This report has been signed electronically.

## 2020-06-29 NOTE — Progress Notes (Signed)
Medical history reviewed with no changes noted. VS assessed by C.W 

## 2020-06-29 NOTE — Telephone Encounter (Signed)
Pt advised her MRI results.  Pt states right now she taking Tylenol and extra strength Excedrin for her headache and Mg and Riboflavin.     Advised pt if she needs  [place] to call something in just give [place] a call.

## 2020-06-29 NOTE — Patient Instructions (Signed)
Information on polyps and hemorrhoids given to you today.  Await pathology results.  Resume previous diet and medications.   YOU HAD AN ENDOSCOPIC PROCEDURE TODAY AT THE  ENDOSCOPY CENTER:   Refer to the procedure report that was given to you for any specific questions about what was found during the examination.  If the procedure report does not answer your questions, please call your gastroenterologist to clarify.  If you requested that your care partner not be given the details of your procedure findings, then the procedure report has been included in a sealed envelope for you to review at your convenience later.  YOU SHOULD EXPECT: Some feelings of bloating in the abdomen. Passage of more gas than usual.  Walking can help get rid of the air that was put into your GI tract during the procedure and reduce the bloating. If you had a lower endoscopy (such as a colonoscopy or flexible sigmoidoscopy) you may notice spotting of blood in your stool or on the toilet paper. If you underwent a bowel prep for your procedure, you may not have a normal bowel movement for a few days.  Please Note:  You might notice some irritation and congestion in your nose or some drainage.  This is from the oxygen used during your procedure.  There is no need for concern and it should clear up in a day or so.  SYMPTOMS TO REPORT IMMEDIATELY:  Following lower endoscopy (colonoscopy or flexible sigmoidoscopy):  Excessive amounts of blood in the stool  Significant tenderness or worsening of abdominal pains  Swelling of the abdomen that is new, acute  Fever of 100F or higher   For urgent or emergent issues, a gastroenterologist can be reached at any hour by calling [phone]. Do not use MyChart messaging for urgent concerns.    DIET:  We do recommend a small meal at first, but then you may proceed to your regular diet.  Drink plenty of fluids but you should avoid alcoholic beverages for 24  hours.  ACTIVITY:  You should plan to take it easy for the rest of today and you should NOT DRIVE or use heavy machinery until tomorrow (because of the sedation medicines used during the test).    FOLLOW UP: Our staff will call the number listed on your records 48-72 hours following your procedure to check on you and address any questions or concerns that you may have regarding the information given to you following your procedure. If we do not reach you, we will leave a message.  We will attempt to reach you two times.  During this call, we will ask if you have developed any symptoms of COVID 19. If you develop any symptoms (ie: fever, flu-like symptoms, shortness of breath, cough etc.) before then, please call [phone].  If you test positive for Covid 19 in the 2 weeks post procedure, please call and report this information to us.    If any biopsies were taken you will be contacted by phone or by letter within the next 1-3 weeks.  Please call us at [phone] if you have not heard about the biopsies in 3 weeks.    SIGNATURES/CONFIDENTIALITY: You and/or your care partner have signed paperwork which will be entered into your electronic medical record.  These signatures attest to the fact that that the information above on your After Visit Summary has been reviewed and is understood.  Full responsibility of the confidentiality of this discharge information lies with you and/or   your care-partner.

## 2020-06-29 NOTE — Progress Notes (Signed)
Called to room to assist during endoscopic procedure.  Patient ID and intended procedure confirmed with present staff. Received instructions for my participation in the procedure from the performing physician.  

## 2020-07-03 ENCOUNTER — Telehealth: Payer: Self-pay | Admitting: *Deleted

## 2020-07-03 NOTE — Telephone Encounter (Signed)
°  Follow up Call-  Call back number 06/29/2020  Post procedure Call Back phone  # [phone]  Permission to leave phone message Yes  Some recent data might be hidden     Patient questions:  Do you have a fever, pain , or abdominal swelling? No. Pain Score  0 *  Have you tolerated food without any problems? Yes.    Have you been able to return to your normal activities? Yes.    Do you have any questions about your discharge instructions: Diet   No. Medications  No. Follow up visit  No.  Do you have questions or concerns about your Care? No.  Actions: * If pain score is 4 or above: No action needed, pain <4.  1. Have you developed a fever since your procedure? no  2.   Have you had an respiratory symptoms (SOB or cough) since your procedure? no  3.   Have you tested positive for COVID 19 since your procedure no  4.   Have you had any family members/close contacts diagnosed with the COVID 19 since your procedure? no   If yes to any of these questions please route to Joylene John, RN and Joella Prince, RN

## 2020-07-09 DIAGNOSIS — M25641 Stiffness of right hand, not elsewhere classified: Secondary | ICD-10-CM | POA: Diagnosis not present

## 2020-07-09 DIAGNOSIS — S6991XA Unspecified injury of right wrist, hand and finger(s), initial encounter: Secondary | ICD-10-CM | POA: Diagnosis not present

## 2020-07-09 DIAGNOSIS — M79644 Pain in right finger(s): Secondary | ICD-10-CM | POA: Diagnosis not present

## 2020-07-09 DIAGNOSIS — S63659A Sprain of metacarpophalangeal joint of unspecified finger, initial encounter: Secondary | ICD-10-CM | POA: Diagnosis not present

## 2020-07-16 DIAGNOSIS — S63659A Sprain of metacarpophalangeal joint of unspecified finger, initial encounter: Secondary | ICD-10-CM | POA: Diagnosis not present

## 2020-07-16 DIAGNOSIS — S6991XA Unspecified injury of right wrist, hand and finger(s), initial encounter: Secondary | ICD-10-CM | POA: Diagnosis not present

## 2020-07-16 DIAGNOSIS — M25641 Stiffness of right hand, not elsewhere classified: Secondary | ICD-10-CM | POA: Diagnosis not present

## 2020-07-16 DIAGNOSIS — M79644 Pain in right finger(s): Secondary | ICD-10-CM | POA: Diagnosis not present

## 2020-07-24 DIAGNOSIS — M79644 Pain in right finger(s): Secondary | ICD-10-CM | POA: Diagnosis not present

## 2020-07-24 DIAGNOSIS — M25641 Stiffness of right hand, not elsewhere classified: Secondary | ICD-10-CM | POA: Diagnosis not present

## 2020-07-24 DIAGNOSIS — S63659A Sprain of metacarpophalangeal joint of unspecified finger, initial encounter: Secondary | ICD-10-CM | POA: Diagnosis not present

## 2020-07-24 DIAGNOSIS — S6991XA Unspecified injury of right wrist, hand and finger(s), initial encounter: Secondary | ICD-10-CM | POA: Diagnosis not present

## 2020-08-09 ENCOUNTER — Ambulatory Visit: Payer: Self-pay | Admitting: Neurology

## 2020-08-21 DIAGNOSIS — M65321 Trigger finger, right index finger: Secondary | ICD-10-CM | POA: Diagnosis not present

## 2020-08-21 DIAGNOSIS — S63659D Sprain of metacarpophalangeal joint of unspecified finger, subsequent encounter: Secondary | ICD-10-CM | POA: Diagnosis not present

## 2020-09-20 ENCOUNTER — Encounter: Payer: Self-pay | Admitting: Nurse Practitioner

## 2020-11-19 NOTE — Progress Notes (Signed)
NEUROLOGY FOLLOW UP OFFICE NOTE  Inell Mimbs [id number]  Assessment/Plan:   Possible thunderclap headache - infrequent  1.  Will continue to treat with Excedrin or Tylenol as needed. 2.  If headaches worsen, she will contact me and we can start a preventative.  Otherwise, follow up as needed.  Subjective:  Avalie Oconnor is a 60 year old right-handed female with Crohn's disease who follows up for headache.  UPDATE: Current medications/supplements:  Tylenol, Excedrin.  Sometimes benadryl at night.  MRI of brain with and without contrast and MRA of head and neck on 06/26/2020 personally reviewed were normal.    Headaches are improved.  They are now occurring no more than once a month.  Occurs in the morning around 5 AM.  She takes Excedrin which helps.  Notes a little sinus pressure.    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  She had to keep still as any movement aggravated it.  Maybe some mild dizziness.  No associated visual disturbance, nausea, vomiting, speech disturbance, photophobia, phonophobia, numbness or weakness.  When she got home, she checked her blood pressure which was 465 systolic.  The severe headache lasted a couple of hours.  She went to sleep and was in bed until the following afternoon.  She had a dull headache for 2 to 3 days.  Since then, she has had two other similar headaches.  She has had these headaches since 89 or 59 years old.  However, they typically wake her up in the early morning and occur no more than once a month.  She treats with Tylenol or sometimes Excedrin.  She has had 3 headaches over the past month.  She reports some new stressors but nothing significant.  She denies any change in sleep or eating schedule.  No new medications or head trauma.    No known family history of headache.  PAST MEDICAL HISTORY: Past Medical History:   Diagnosis Date  . Allergy    h/o in past--no ongoing issues  . Crohn's disease ([place])    GI in [place], [place]  . Crohn's disease ([place])   . Skin cancer, basal cell    Dr. Ubaldo Glassing (right shoulder, RLE, back, chest)    MEDICATIONS: Current Outpatient Medications on File Prior to Visit  Medication Sig Dispense Refill  . acetaminophen (TYLENOL) 325 MG tablet Take 650 mg by mouth as needed for pain.    . cholecalciferol (VITAMIN D) 1000 UNITS tablet Take 1,000 Units by mouth daily.    . diazepam (VALIUM) 5 MG tablet Take 1 tablet 40 to 60 minutes prior to MRI 1 tablet 0  . fish oil-omega-3 fatty acids 1000 MG capsule Take 1 g by mouth daily.     . Multiple Vitamins-Calcium (ONE-A-DAY WOMENS) tablet Take 1 tablet by mouth daily.       No current facility-administered medications on file prior to visit.    ALLERGIES: Allergies  Allergen Reactions  . Guaifenesin Er Diarrhea and Nausea Only  . Morphine And Related Hives and Itching    FAMILY HISTORY: Family History  Problem Relation Age of Onset  . Heart disease Mother        CABG at 26; smoker  . Diabetes Mother   . Hypertension Mother   . Heart disease Father 54       died of massive MI at 66  . Hypertension Father   . Hyperlipidemia Sister   .  Hypertension Sister   . Diabetes Sister   . Hyperlipidemia Brother   . Hypertension Brother   . Diabetes Brother   . Cancer Maternal Grandmother        ?type  . Hyperlipidemia Brother   . Hypertension Brother   . Hyperlipidemia Brother   . Hypertension Brother   . Hyperlipidemia Sister   . Hypertension Sister   . Diabetes Other   . Cancer Maternal Aunt        ?type  . Colon cancer Neg Hx   . Pancreatic cancer Neg Hx   . Esophageal cancer Neg Hx   . Stomach cancer Neg Hx   . Rectal cancer Neg Hx   . Prostate cancer Neg Hx       Objective:  Blood pressure 136/76, pulse 91, resp. rate 18, height 5' 7.5" (1.715 m), weight 166 lb (75.3 kg), SpO2 97 %. General: No acute  distress.  Patient appears well-groomed.   Head:  Normocephalic/atraumatic Eyes:  Fundi examined but not visualized Neck: supple, no paraspinal tenderness, full range of motion Heart:  Regular rate and rhythm Lungs:  Clear to auscultation bilaterally Back: No paraspinal tenderness Neurological Exam: alert and oriented to person, place, and time. Attention span and concentration intact, recent and remote memory intact, fund of knowledge intact.  Speech fluent and not dysarthric, language intact.  CN II-XII intact. Bulk and tone normal, muscle strength 5/5 throughout.  Sensation to light touch  intact.  Deep tendon reflexes 2+ throughout, toes downgoing.  Finger to nose and heel to shin testing intact.  Gait normal, Romberg negative.     Metta Clines, DO  CC: Apolonio Schneiders, FNP

## 2020-11-20 ENCOUNTER — Ambulatory Visit: Payer: BC Managed Care – PPO | Admitting: Neurology

## 2020-11-20 ENCOUNTER — Telehealth: Payer: Self-pay

## 2020-11-20 ENCOUNTER — Other Ambulatory Visit: Payer: Self-pay

## 2020-11-20 ENCOUNTER — Encounter: Payer: Self-pay | Admitting: Neurology

## 2020-11-20 VITALS — BP 136/76 | HR 91 | Resp 18 | Ht 67.5 in | Wt 166.0 lb

## 2020-11-20 DIAGNOSIS — G4453 Primary thunderclap headache: Secondary | ICD-10-CM

## 2020-11-20 NOTE — Telephone Encounter (Signed)
Spoke with patient on phone regarding allergy/cold/covid like symptoms. Patient reported taking rapid covid test at home yesterday and it was negative. Informed patient that per [facility] we would need a current negative covid test today before we could see patient in office/set up virtual appt. Patient refused to come to clinic to get current covid test.

## 2020-11-20 NOTE — Patient Instructions (Signed)
If you have increase in headaches, contact me and I will start you on a medication and then have you follow up

## 2020-11-21 DIAGNOSIS — J0100 Acute maxillary sinusitis, unspecified: Secondary | ICD-10-CM | POA: Diagnosis not present

## 2021-02-05 ENCOUNTER — Ambulatory Visit: Payer: BC Managed Care – PPO | Admitting: Nurse Practitioner

## 2021-02-05 ENCOUNTER — Other Ambulatory Visit: Payer: Self-pay

## 2021-02-05 ENCOUNTER — Other Ambulatory Visit: Payer: Self-pay | Admitting: Nurse Practitioner

## 2021-02-05 VITALS — HR 73 | Temp 97.5°F | Wt 168.0 lb

## 2021-02-05 DIAGNOSIS — J40 Bronchitis, not specified as acute or chronic: Secondary | ICD-10-CM

## 2021-02-05 MED ORDER — ALBUTEROL SULFATE HFA 108 (90 BASE) MCG/ACT IN AERS: 2.0000 | INHALATION_SPRAY | Freq: Four times a day (QID) | RESPIRATORY_TRACT | 0 refills | Status: DC | PRN

## 2021-02-05 MED ORDER — ALBUTEROL SULFATE HFA 108 (90 BASE) MCG/ACT IN AERS: 2.0000 | INHALATION_SPRAY | Freq: Four times a day (QID) | RESPIRATORY_TRACT | 1 refills | Status: DC | PRN

## 2021-02-05 MED ORDER — AZITHROMYCIN 250 MG PO TABS: ORAL_TABLET | ORAL | 0 refills | Status: AC

## 2021-02-05 NOTE — Progress Notes (Signed)
   Subjective:    Patient ID: Ruth Carpenter, female    DOB: 12-Jul-1961, 60 y.o.   MRN: [id number]  HPI  60 year old female presenting to [facility] with complaints of sinus congestion/ fullness in her ears and a productive cough for the past week. 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   Denies any close contacts  Today's Vitals   02/05/21 1044  BP: 128/78  Pulse: 73  Temp: (!) 97.5 F (36.4 C)  SpO2: 98%  Weight: 168 lb (76.2 kg)   Body mass index is 25.92 kg/m.   Review of Systems  Constitutional: Negative.   HENT:  Positive for congestion, postnasal drip, sinus pressure and sinus pain.   Eyes: Negative.   Respiratory:  Positive for cough.   Cardiovascular: Negative.   Musculoskeletal: Negative.   Neurological: Negative.       Objective:   Physical Exam Constitutional:      Appearance: Normal appearance.  HENT:     Head: Normocephalic.     Right Ear: Tympanic membrane, ear canal and external ear normal.     Left Ear: Tympanic membrane, ear canal and external ear normal.     Nose: Congestion and rhinorrhea present.     Mouth/Throat:     Mouth: Mucous membranes are moist.  Eyes:     Pupils: Pupils are equal, round, and reactive to light.  Cardiovascular:     Rate and Rhythm: Normal rate and regular rhythm.     Heart sounds: Normal heart sounds.  Pulmonary:     Effort: Pulmonary effort is normal.     Breath sounds: Normal breath sounds.  Skin:    General: Skin is warm and dry.     Capillary Refill: Capillary refill takes less than 2 seconds.  Neurological:     General: No focal deficit present.     Mental Status: She is alert.  Psychiatric:        Mood and Affect: Mood normal.          Assessment & Plan:   Return to clinic if symptoms persist or with new concerns as discussed.   Meds ordered this encounter  Medications   azithromycin (ZITHROMAX) 250 MG tablet    Sig: Take 2 tablets on day 1, then 1 tablet  daily on days 2 through 5    Dispense:  6 tablet    Refill:  0   DISCONTD: albuterol (VENTOLIN HFA) 108 (90 Base) MCG/ACT inhaler    Sig: Inhale 2 puffs into the lungs every 6 (six) hours as needed for wheezing or shortness of breath.    Dispense:  8 g    Refill:  0   albuterol (VENTOLIN HFA) 108 (90 Base) MCG/ACT inhaler    Sig: Inhale 2 puffs into the lungs every 6 (six) hours as needed for up to 10 days for wheezing or shortness of breath.    Dispense:  8 g    Refill:  1

## 2021-02-07 ENCOUNTER — Other Ambulatory Visit: Payer: Self-pay | Admitting: Nurse Practitioner

## 2021-02-07 DIAGNOSIS — J40 Bronchitis, not specified as acute or chronic: Secondary | ICD-10-CM

## 2021-05-20 ENCOUNTER — Other Ambulatory Visit: Payer: Self-pay

## 2021-05-20 ENCOUNTER — Ambulatory Visit: Payer: BC Managed Care – PPO | Admitting: Medical

## 2021-05-20 DIAGNOSIS — L089 Local infection of the skin and subcutaneous tissue, unspecified: Secondary | ICD-10-CM

## 2021-05-20 DIAGNOSIS — R229 Localized swelling, mass and lump, unspecified: Secondary | ICD-10-CM

## 2021-05-20 MED ORDER — AMOXICILLIN 875 MG PO TABS: 875.0000 mg | ORAL_TABLET | Freq: Two times a day (BID) | ORAL | 0 refills | Status: AC

## 2021-05-20 NOTE — Progress Notes (Signed)
   Subjective:    Patient ID: Ruth Carpenter, female    DOB: Aug 01, 1961, 60 y.o.   MRN: [id number]  HPI  60 yo female in non acute distress, presents today with 2 day history of lump on the side of the neck with increased swelling x 2 days ( this has been there a long time I don't recall since when but it is now bigger).  Review of Systems  Constitutional:  Negative for chills and fever.  Skin:        Change in skin tissue on the left side posterior base of neck., increased size.      Objective:   Physical Exam Neck:    Oval shaped , non tender, no erythema, firm feeling  ( which she states is new).       Assessment & Plan:  skin infection ? Cyst vs growth Left side of neck Meds ordered this encounter  Medications   amoxicillin (AMOXIL) 875 MG tablet    Sig: Take 1 tablet (875 mg total) by mouth 2 (two) times daily for 10 days.    Dispense:  20 tablet    Refill:  0  Referral Orders         Ambulatory referral to Dermatology     Return to the clinic as needed.   Attempted to call patient to see how she was doing and to see if she saw her dermatologist, no answer, no ability to leave a message.

## 2021-05-23 DIAGNOSIS — L720 Epidermal cyst: Secondary | ICD-10-CM | POA: Diagnosis not present

## 2021-07-24 DIAGNOSIS — L821 Other seborrheic keratosis: Secondary | ICD-10-CM | POA: Diagnosis not present

## 2021-07-24 DIAGNOSIS — D485 Neoplasm of uncertain behavior of skin: Secondary | ICD-10-CM | POA: Diagnosis not present

## 2021-07-24 DIAGNOSIS — L570 Actinic keratosis: Secondary | ICD-10-CM | POA: Diagnosis not present

## 2021-07-24 DIAGNOSIS — Z85828 Personal history of other malignant neoplasm of skin: Secondary | ICD-10-CM | POA: Diagnosis not present

## 2021-07-24 DIAGNOSIS — L814 Other melanin hyperpigmentation: Secondary | ICD-10-CM | POA: Diagnosis not present

## 2021-10-10 ENCOUNTER — Telehealth: Payer: Self-pay

## 2021-10-10 NOTE — Telephone Encounter (Signed)
I called and left message for patient to see if she had been seen at the dermatologist's office. A referral was made via [facility] in November. Requested that she call us back with update. ?

## 2021-10-10 NOTE — Telephone Encounter (Signed)
Mobeetie and left msg on voicemail. Advised that our office had made a referral to Dr. Sarina Ser in November and wanted to find out if patient had been seen. Requested that their office call us back to follow up and if not seen, to please contact patient for appointment. ?

## 2021-10-22 ENCOUNTER — Other Ambulatory Visit: Payer: Self-pay

## 2021-10-22 ENCOUNTER — Ambulatory Visit: Payer: BC Managed Care – PPO | Admitting: Medical

## 2021-10-22 DIAGNOSIS — F40243 Fear of flying: Secondary | ICD-10-CM

## 2021-10-22 NOTE — Progress Notes (Signed)
? ?  Ruth Carpenter asks for refill of her xanax 0.5 mg , which she takes prior to flying. ?Dr. Posey Pronto used to write for her the medication. ?I recommended she have her PCP write for her. She states she does not currently have a PCP. I recommended she get a PCP for continued writing of schedule 2 medication.she verbalizes understanding and has no questions at the end of our conversation. ? ?She had discussed with Judson Roch about getting a PCP but she has not done so as of yet.  ? ?Hand written prescription of Xanax 0.'5mg'$  to take one po  prior to flying. #10 no refills. ?

## 2021-11-25 NOTE — Progress Notes (Signed)
? ?NEUROLOGY FOLLOW UP OFFICE NOTE ? ?Ruth Carpenter ?[id number] ? ?Assessment/Plan:  ? ?Possible thunderclap headache - infrequent ?  ?1.  Will continue to treat with Excedrin or Tylenol as needed. ?2.  If headaches worsen, she will contact me and we can start a preventative.  Otherwise, follow up as needed. ?  ?Subjective:  ?Ruth Carpenter is a 61 year old right-handed female with Crohn's disease who follows up for headache. ?  ?UPDATE: ?Current medications/supplements:  Tylenol, Excedrin Extra-strength.  Sometimes benadryl at night. ?  ? Sometimes has a nagging headache and sometimes the severe headache.  The severe headaches are infrequent.  Last one occurred 1-2 months ago.   ?  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  She had to keep still as any movement aggravated it.  Maybe some mild dizziness.  No associated visual disturbance, nausea, vomiting, speech disturbance, photophobia, phonophobia, numbness or weakness.  When she got home, she checked her blood pressure which was 782 systolic.  The severe headache lasted a couple of hours.  She went to sleep and was in bed until the following afternoon.  She had a dull headache for 2 to 3 days.  Since then, she has had two other similar headaches.  She has had these headaches since 27 or 61 years old.  However, they typically wake her up in the early morning and occur no more than once a month.  She treats with Tylenol or sometimes Excedrin.  She reports some new stressors but nothing significant.  She denies any change in sleep or eating schedule.  No new medications or head trauma.  MRI of brain with and without contrast and MRA of head and neck on 06/26/2020 were normal.  ? ?PAST MEDICAL HISTORY: ?Past Medical History:  ?Diagnosis Date  ? Allergy   ? h/o in past--no ongoing issues  ? Crohn's disease ([place])   ? GI in [place], [place]  ? Crohn's disease ([place])   ?  Skin cancer, basal cell   ? Dr. Ubaldo Glassing (right shoulder, RLE, back, chest)  ? ? ?MEDICATIONS: ?Current Outpatient Medications on File Prior to Visit  ?Medication Sig Dispense Refill  ? acetaminophen (TYLENOL) 325 MG tablet Take 650 mg by mouth as needed for pain.    ? albuterol (VENTOLIN HFA) 108 (90 Base) MCG/ACT inhaler INHALE 2 PUFFS INTO THE LUNGS EVERY 6 HOURS FOR UP TO 10 DAYS AS NEEDED FOR WHEEZING OR SHORTNESS OF BREATH 20.1 g 1  ? cholecalciferol (VITAMIN D) 1000 UNITS tablet Take 1,000 Units by mouth daily.    ? diazepam (VALIUM) 5 MG tablet Take 1 tablet 40 to 60 minutes prior to MRI 1 tablet 0  ? fish oil-omega-3 fatty acids 1000 MG capsule Take 1 g by mouth daily.    ? Multiple Vitamins-Calcium (ONE-A-DAY WOMENS) tablet Take 1 tablet by mouth daily.    ? ?No current facility-administered medications on file prior to visit.  ? ? ?ALLERGIES: ?Allergies  ?Allergen Reactions  ? Guaifenesin Er Diarrhea and Nausea Only  ? Morphine And Related Hives and Itching  ? ? ?FAMILY HISTORY: ?Family History  ?Problem Relation Age of Onset  ? Heart disease Mother   ?     CABG at 71; smoker  ? Diabetes Mother   ? Hypertension Mother   ? Heart disease Father 71  ?     died of massive MI at 4  ? Hypertension Father   ?  Hyperlipidemia Sister   ? Hypertension Sister   ? Diabetes Sister   ? Hyperlipidemia Brother   ? Hypertension Brother   ? Diabetes Brother   ? Cancer Maternal Grandmother   ?     ?type  ? Hyperlipidemia Brother   ? Hypertension Brother   ? Hyperlipidemia Brother   ? Hypertension Brother   ? Hyperlipidemia Sister   ? Hypertension Sister   ? Diabetes Other   ? Cancer Maternal Aunt   ?     ?type  ? Colon cancer Neg Hx   ? Pancreatic cancer Neg Hx   ? Esophageal cancer Neg Hx   ? Stomach cancer Neg Hx   ? Rectal cancer Neg Hx   ? Prostate cancer Neg Hx   ? ? ?  ?Objective:  ?Blood pressure 136/84, pulse 76, height '5\' 7"'$  (1.702 m), weight 165 lb 3.2 oz (74.9 kg), SpO2 98 %. ?General: No acute distress.  Patient  appears ell-groomed.   ?Head:  Normocephalic/atraumatic ?Eyes:  Fundi examined but not visualized ?Neurological Exam: alert and oriented to person, place, and time.  Speech fluent and not dysarthric, language intact.  CN II-XII intact. Bulk and tone normal, muscle strength 5/5 throughout.  Sensation to light touch intact.  Deep tendon reflexes 2+ throughout.  Finger to nose testing intact.  Gait normal, Romberg negative. ? ? ?Metta Clines, DO ? ? ? ? ? ? ? ? ?

## 2021-11-26 ENCOUNTER — Ambulatory Visit: Payer: BC Managed Care – PPO | Admitting: Neurology

## 2021-11-26 ENCOUNTER — Encounter: Payer: Self-pay | Admitting: Neurology

## 2021-11-26 VITALS — BP 136/84 | HR 76 | Ht 67.0 in | Wt 165.2 lb

## 2021-11-26 DIAGNOSIS — G4453 Primary thunderclap headache: Secondary | ICD-10-CM | POA: Diagnosis not present

## 2021-11-26 DIAGNOSIS — G44219 Episodic tension-type headache, not intractable: Secondary | ICD-10-CM

## 2021-11-26 NOTE — Patient Instructions (Signed)
Continue Excedrin Extra-strength as needed but Limit use of pain relievers to no more than 2 days out of week to prevent risk of rebound or medication-overuse headache. ?Follow up as needed ?

## 2021-11-28 ENCOUNTER — Encounter: Payer: Self-pay | Admitting: Neurology

## 2022-04-24 ENCOUNTER — Other Ambulatory Visit: Payer: Self-pay | Admitting: Family Medicine

## 2022-04-24 DIAGNOSIS — M25551 Pain in right hip: Secondary | ICD-10-CM

## 2022-04-27 ENCOUNTER — Ambulatory Visit
Admission: RE | Admit: 2022-04-27 | Discharge: 2022-04-27 | Disposition: A | Discharge status: Home / Self Care | Payer: BC Managed Care – PPO | Source: Ambulatory Visit | Attending: Family Medicine | Admitting: Family Medicine

## 2022-04-27 DIAGNOSIS — M25551 Pain in right hip: Secondary | ICD-10-CM

## 2022-05-23 ENCOUNTER — Other Ambulatory Visit: Payer: Self-pay

## 2022-05-23 DIAGNOSIS — Z01818 Encounter for other preprocedural examination: Secondary | ICD-10-CM

## 2022-05-24 LAB — BASIC METABOLIC PANEL
BUN/Creatinine Ratio: 18 (ref 12–28)
BUN: 13 mg/dL (ref 8–27)
CO2: 22 mmol/L (ref 20–29)
Calcium: 9.6 mg/dL (ref 8.7–10.3)
Chloride: 104 mmol/L (ref 96–106)
Creatinine, Ser: 0.73 mg/dL (ref 0.57–1.00)
Glucose: 95 mg/dL (ref 70–99)
Potassium: 4.1 mmol/L (ref 3.5–5.2)
Sodium: 140 mmol/L (ref 134–144)
eGFR: 93 mL/min/{1.73_m2} (ref >59)

## 2022-05-24 LAB — CBC WITH DIFFERENTIAL/PLATELET
Basophils Absolute: 0 10*3/uL (ref 0.0–0.2)
Basos: 1 %
EOS (ABSOLUTE): 0.1 10*3/uL (ref 0.0–0.4)
Eos: 1 %
Hematocrit: 39.2 % (ref 34.0–46.6)
Hemoglobin: 13.4 g/dL (ref 11.1–15.9)
Immature Grans (Abs): 0 10*3/uL (ref 0.0–0.1)
Immature Granulocytes: 0 %
Lymphocytes Absolute: 1.7 10*3/uL (ref 0.7–3.1)
Lymphs: 24 %
MCH: 30.1 pg (ref 26.6–33.0)
MCHC: 34.2 g/dL (ref 31.5–35.7)
MCV: 88 fL (ref 79–97)
Monocytes Absolute: 0.5 10*3/uL (ref 0.1–0.9)
Monocytes: 7 %
Neutrophils Absolute: 4.7 10*3/uL (ref 1.4–7.0)
Neutrophils: 67 %
Platelets: 236 10*3/uL (ref 150–450)
RBC: 4.45 x10E6/uL (ref 3.77–5.28)
RDW: 12.7 % (ref 11.7–15.4)
WBC: 6.9 10*3/uL (ref 3.4–10.8)

## 2022-05-24 LAB — HGB A1C W/O EAG: Hgb A1c MFr Bld: 5.6 % (ref 4.8–5.6)

## 2022-05-28 ENCOUNTER — Encounter: Payer: Self-pay | Admitting: Nurse Practitioner

## 2022-05-28 ENCOUNTER — Ambulatory Visit (INDEPENDENT_AMBULATORY_CARE_PROVIDER_SITE_OTHER): Payer: Self-pay | Admitting: Nurse Practitioner

## 2022-05-28 VITALS — BP 118/80 | HR 65 | Temp 98.5°F | Ht 67.0 in | Wt 163.4 lb

## 2022-05-28 DIAGNOSIS — Z01818 Encounter for other preprocedural examination: Secondary | ICD-10-CM

## 2022-05-28 LAB — POCT URINALYSIS DIPSTICK OB
Bilirubin, UA: NEGATIVE
Blood, UA: NEGATIVE
Glucose, UA: NEGATIVE
Ketones, UA: NEGATIVE
Leukocytes, UA: NEGATIVE
Nitrite, UA: NEGATIVE
POC,PROTEIN,UA: NEGATIVE
Spec Grav, UA: 1.01 (ref 1.010–1.025)
Urobilinogen, UA: 0.2 E.U./dL
pH, UA: 7 (ref 5.0–8.0)

## 2022-05-28 NOTE — Progress Notes (Signed)
[facility] [address], [place] [address] [phone]  Office Visit Note  Patient Name: Ruth Carpenter Date of Birth [id number]  Medical Record number [id number]  Date of Service: 05/28/2022  Chief Complaint  Patient presents with   Annual Exam    Physical for surgery      HPI  61 year old female presenting for pre surgical physical. Medical history includes migraine HA for which she has been evaluated by neurology for and have improved and continue to be managed with OTC medications.   She is planning for total hip replacement with [facility].   Labs below (WNL)  Finishing Keflex course for UTI last week     Uses Excedrin as needed Current Medication: . Current Outpatient Medications  Medication Instructions   acetaminophen (TYLENOL) 650 mg, As needed   aspirin-acetaminophen-caffeine (EXCEDRIN MIGRAINE) 250-250-65 MG tablet Oral, Every 6 hours PRN   cephALEXin (KEFLEX) 500 mg, Oral, 2 times daily   Multiple Vitamins-Calcium (ONE-A-DAY WOMENS) tablet 1 tablet, Oral, Daily,           Medical History: Past Medical History:  Diagnosis Date   Allergy    h/o in past--no ongoing issues   Crohn's disease ([place])    GI in [place], [place]   Crohn's disease ([place])    Skin cancer, basal cell    Dr. Ubaldo Glassing (right shoulder, RLE, back, chest)     Vital Signs: BP 118/80 (BP Location: Left Arm, Patient Position: Sitting, Cuff Size: Normal)   Pulse 65   Temp 98.5 F (36.9 C) (Tympanic)   Ht $R'5\' 7"'oq$  (1.702 m)   Wt 163 lb 6.4 oz (74.1 kg)   SpO2 98%   BMI 25.59 kg/m    Review of Systems  Constitutional: Negative.   HENT: Negative.    Eyes: Negative.   Respiratory: Negative.    Cardiovascular: Negative.   Gastrointestinal: Negative.   Endocrine: Negative.   Genitourinary: Negative.   Musculoskeletal:  Positive for arthralgias.  Skin: Negative.   Allergic/Immunologic: Negative.   Neurological: Negative.   Psychiatric/Behavioral: Negative.       Physical Exam HENT:     Nose: Nose normal.     Mouth/Throat:     Mouth: Mucous membranes are moist.  Eyes:     Pupils: Pupils are equal, round, and reactive to light.  Cardiovascular:     Rate and Rhythm: Regular rhythm. Bradycardia present.     Pulses: Normal pulses.     Heart sounds: Normal heart sounds.  Pulmonary:     Effort: Pulmonary effort is normal.     Breath sounds: Normal breath sounds.  Musculoskeletal:     Cervical back: Normal range of motion.  Skin:    General: Skin is warm.     Capillary Refill: Capillary refill takes less than 2 seconds.  Neurological:     General: No focal deficit present.     Mental Status: She is alert and oriented to person, place, and time. Mental status is at baseline.  Psychiatric:        Mood and Affect: Mood normal.     Recent Results (from the past 2160 hour(s))  CBC with Differential/Platelet     Status: None   Collection Time: 05/23/22  9:18 AM  Result Value Ref Range   WBC 6.9 3.4 - 10.8 x10E3/uL   RBC 4.45 3.77 - 5.28 x10E6/uL   Hemoglobin 13.4 11.1 - 15.9 g/dL   Hematocrit 39.2 34.0 - 46.6 %   MCV 88  79 - 97 fL   MCH 30.1 26.6 - 33.0 pg   MCHC 34.2 31.5 - 35.7 g/dL   RDW 12.7 11.7 - 15.4 %   Platelets 236 150 - 450 x10E3/uL   Neutrophils 67 Not Estab. %   Lymphs 24 Not Estab. %   Monocytes 7 Not Estab. %   Eos 1 Not Estab. %   Basos 1 Not Estab. %   Neutrophils Absolute 4.7 1.4 - 7.0 x10E3/uL   Lymphocytes Absolute 1.7 0.7 - 3.1 x10E3/uL   Monocytes Absolute 0.5 0.1 - 0.9 x10E3/uL   EOS (ABSOLUTE) 0.1 0.0 - 0.4 x10E3/uL   Basophils Absolute 0.0 0.0 - 0.2 x10E3/uL   Immature Granulocytes 0 Not Estab. %   Immature Grans (Abs) 0.0 0.0 - 0.1 [id number]  Basic metabolic panel     Status: None   Collection Time: 05/23/22  9:18 AM  Result Value Ref Range   Glucose 95 70 - 99 mg/dL   BUN 13 8 - 27 mg/dL   Creatinine, Ser 0.73 0.57 - 1.00 mg/dL   eGFR 93 >59 mL/min/1.73   BUN/Creatinine Ratio 18 12 - 28   Sodium  140 134 - 144 mmol/L   Potassium 4.1 3.5 - 5.2 mmol/L   Chloride 104 96 - 106 mmol/L   CO2 22 20 - 29 mmol/L   Calcium 9.6 8.7 - 10.3 mg/dL  Hgb A1c w/o eAG     Status: None   Collection Time: 05/23/22  9:18 AM  Result Value Ref Range   Hgb A1c MFr Bld 5.6 4.8 - 5.6 %    Comment:          Prediabetes: 5.7 - 6.4          Diabetes: >6.4          Glycemic control for adults with diabetes: <7.0   POC Urinalysis Dipstick OB     Status: None   Collection Time: 05/28/22 12:00 PM  Result Value Ref Range   Color, UA clear    Clarity, UA clear    Glucose, UA Negative Negative   Bilirubin, UA negative    Ketones, UA negative    Spec Grav, UA 1.010 1.010 - 1.025   Blood, UA negative    pH, UA 7.0 5.0 - 8.0   POC,PROTEIN,UA Negative Negative, Trace, Small (1+), Moderate (2+), Large (3+), 4+   Urobilinogen, UA 0.2 0.2 or 1.0 E.U./dL   Nitrite, UA negative    Leukocytes, UA Negative Negative   Appearance     Odor         Assessment/Plan: 1. Encounter for preoperative examination for general surgical procedure Stop Vitamin D and Fish Oil 7 days prior to surgery  No Excedrin or NSAIDs for 7 days prior  May use tylenol as needed     Cleared for surgery: low risk   General Counseling: Shealyn verbalizes understanding of the findings of todays visit and agrees with plan of treatment. I have discussed any further diagnostic evaluation that may be needed or ordered today. We also reviewed her medications today. she has been encouraged to call the office with any questions or concerns that should arise related to todays visit.     Time spent:30 [place] [facility] Family Nurse Practitioner

## 2022-07-23 DIAGNOSIS — M5416 Radiculopathy, lumbar region: Secondary | ICD-10-CM | POA: Diagnosis not present

## 2022-07-23 DIAGNOSIS — M79604 Pain in right leg: Secondary | ICD-10-CM | POA: Diagnosis not present

## 2022-07-26 DIAGNOSIS — M79604 Pain in right leg: Secondary | ICD-10-CM | POA: Diagnosis not present

## 2022-08-18 DIAGNOSIS — L570 Actinic keratosis: Secondary | ICD-10-CM | POA: Diagnosis not present

## 2022-08-18 DIAGNOSIS — Z85828 Personal history of other malignant neoplasm of skin: Secondary | ICD-10-CM | POA: Diagnosis not present

## 2022-08-18 DIAGNOSIS — L814 Other melanin hyperpigmentation: Secondary | ICD-10-CM | POA: Diagnosis not present

## 2022-08-18 DIAGNOSIS — L821 Other seborrheic keratosis: Secondary | ICD-10-CM | POA: Diagnosis not present

## 2022-08-18 DIAGNOSIS — C44519 Basal cell carcinoma of skin of other part of trunk: Secondary | ICD-10-CM | POA: Diagnosis not present

## 2022-08-18 DIAGNOSIS — D485 Neoplasm of uncertain behavior of skin: Secondary | ICD-10-CM | POA: Diagnosis not present

## 2022-08-28 ENCOUNTER — Ambulatory Visit
Admission: RE | Admit: 2022-08-28 | Discharge: 2022-08-28 | Disposition: A | Discharge status: Home / Self Care | Payer: BC Managed Care – PPO | Source: Ambulatory Visit | Attending: Physician Assistant | Admitting: Physician Assistant

## 2022-08-28 ENCOUNTER — Ambulatory Visit (INDEPENDENT_AMBULATORY_CARE_PROVIDER_SITE_OTHER): Payer: Self-pay | Admitting: Physician Assistant

## 2022-08-28 ENCOUNTER — Encounter: Payer: Self-pay | Admitting: Physician Assistant

## 2022-08-28 VITALS — BP 122/82 | HR 97 | Temp 97.9°F | Wt 174.0 lb

## 2022-08-28 DIAGNOSIS — J069 Acute upper respiratory infection, unspecified: Secondary | ICD-10-CM

## 2022-08-28 DIAGNOSIS — R059 Cough, unspecified: Secondary | ICD-10-CM | POA: Diagnosis not present

## 2022-08-28 DIAGNOSIS — R051 Acute cough: Secondary | ICD-10-CM

## 2022-08-28 DIAGNOSIS — R079 Chest pain, unspecified: Secondary | ICD-10-CM | POA: Diagnosis not present

## 2022-08-28 LAB — POC COVID19 BINAXNOW: SARS Coronavirus 2 Ag: NEGATIVE

## 2022-08-28 MED ORDER — BENZONATATE 200 MG PO CAPS: 200.0000 mg | ORAL_CAPSULE | Freq: Three times a day (TID) | ORAL | 0 refills | Status: DC | PRN

## 2022-08-28 NOTE — Progress Notes (Signed)
[facility] [address], [place] [address]   Office Visit Note  Patient Name: Ruth Carpenter Date of Birth [id number]  Medical Record number [id number]  Date of Service: 08/28/2022  Chief Complaint  Patient presents with   Cough    Coughing at night and early morning. Doesn't feel bad. Only coughs when takes a deep breathe. 530 this morning the pain lasted longer. Lungs hurting.      62 y/o F presents to the clinic for c/o posterior rib/chest discomfort waking up in the AM x 4-5 days, but worse today. When walking around during the day doesn't have that pain/discomfort. +cough and feeling somewhat congested. Denies increased HR. Does feel "out of breath going up the stairs" but admits to being out of shape because she had R hip replaced 11 wks ago and not in shape. Denies leg pain. Denies CP, wheezing, fever, chills, or body aches.   Cough Associated symptoms include shortness of breath (going up the stairs). Pertinent negatives include no chest pain or wheezing.      Current Medication:  Outpatient Encounter Medications as of 08/28/2022  Medication Sig   acetaminophen (TYLENOL) 325 MG tablet Take 650 mg by mouth as needed for pain.   aspirin-acetaminophen-caffeine (EXCEDRIN MIGRAINE) 250-250-65 MG tablet Take by mouth every 6 (six) hours as needed for headache.   benzonatate (TESSALON) 200 MG capsule Take 1 capsule (200 mg total) by mouth 3 (three) times daily as needed for cough.   Multiple Vitamins-Calcium (ONE-A-DAY WOMENS) tablet Take 1 tablet by mouth daily.   cephALEXin (KEFLEX) 500 MG capsule Take 500 mg by mouth 2 (two) times daily. (Patient not taking: Reported on 08/28/2022)   diclofenac (VOLTAREN) 75 MG EC tablet Take 75 mg by mouth 2 (two) times daily.   No facility-administered encounter medications on file as of 08/28/2022.      Medical History: Past Medical History:  Diagnosis Date   Allergy    h/o in past--no ongoing issues   Crohn's disease  ([place])    GI in [place], [place]   Crohn's disease ([place])    Skin cancer, basal cell    Dr. Ubaldo Glassing (right shoulder, RLE, back, chest)     Vital Signs: BP 122/82 (BP Location: Left Arm, Patient Position: Sitting, Cuff Size: Normal)   Pulse 97   Temp 97.9 F (36.6 C) (Tympanic)   Wt 174 lb (78.9 kg)   SpO2 98%   BMI 27.25 kg/m    Review of Systems  Constitutional: Negative.   HENT:  Positive for congestion.   Respiratory:  Positive for cough and shortness of breath (going up the stairs). Negative for chest tightness and wheezing.   Cardiovascular:  Negative for chest pain, palpitations and leg swelling.    Physical Exam Constitutional:      Appearance: Normal appearance.  HENT:     Head: Atraumatic.     Right Ear: Ear canal and external ear normal.     Left Ear: Ear canal and external ear normal.     Ears:     Comments: Extensive air-fluid levels in both ears     Nose: Nose normal.     Mouth/Throat:     Mouth: Mucous membranes are moist.     Pharynx: Oropharynx is clear.  Eyes:     Extraocular Movements: Extraocular movements intact.  Cardiovascular:     Rate and Rhythm: Normal rate and regular rhythm.  Pulmonary:     Effort: Pulmonary effort is normal.  Breath sounds: Normal breath sounds.  Musculoskeletal:     Cervical back: Neck supple.  Skin:    General: Skin is warm.  Neurological:     Mental Status: She is alert.  Psychiatric:        Mood and Affect: Mood normal.        Behavior: Behavior normal.        Thought Content: Thought content normal.        Judgment: Judgment normal.       Assessment/Plan:  1. Acute cough - DG Chest 2 View; Future - benzonatate (TESSALON) 200 MG capsule; Take 1 capsule (200 mg total) by mouth 3 (three) times daily as needed for cough.  Dispense: 30 capsule; Refill: 0 - POC COVID-19 BinaxNow  2. Viral upper respiratory tract infection  Reviewed negative covid test result with patient. Follow up for CXR  Increase  fluids Humidifier Take cough suppressant as prescribed. Continue to watch for worsening symptoms. Pt verbalized understanding and in agreement.   General Counseling: madysin crisp understanding of the findings of todays visit and agrees with plan of treatment. I have discussed any further diagnostic evaluation that may be needed or ordered today. We also reviewed her medications today. she has been encouraged to call the office with any questions or concerns that should arise related to todays visit.    Time spent:20 [place], [place] Physician Assistant

## 2022-08-29 DIAGNOSIS — Z96641 Presence of right artificial hip joint: Secondary | ICD-10-CM | POA: Diagnosis not present

## 2022-08-29 DIAGNOSIS — Z471 Aftercare following joint replacement surgery: Secondary | ICD-10-CM | POA: Diagnosis not present

## 2022-09-02 ENCOUNTER — Telehealth: Payer: Self-pay | Admitting: Physician Assistant

## 2022-09-02 NOTE — Telephone Encounter (Signed)
Reviewed negative CXR result with patient. She verbalized understanding. She is taking otc oral decongestant ie Sudafed which is helping. She will contact us if her symptoms worsen.

## 2022-09-07 DIAGNOSIS — H73893 Other specified disorders of tympanic membrane, bilateral: Secondary | ICD-10-CM | POA: Diagnosis not present

## 2022-09-07 DIAGNOSIS — J208 Acute bronchitis due to other specified organisms: Secondary | ICD-10-CM | POA: Diagnosis not present

## 2022-09-24 DIAGNOSIS — J019 Acute sinusitis, unspecified: Secondary | ICD-10-CM | POA: Diagnosis not present

## 2022-09-24 DIAGNOSIS — B9689 Other specified bacterial agents as the cause of diseases classified elsewhere: Secondary | ICD-10-CM | POA: Diagnosis not present

## 2022-10-29 DIAGNOSIS — M7918 Myalgia, other site: Secondary | ICD-10-CM | POA: Diagnosis not present

## 2022-10-29 DIAGNOSIS — M1712 Unilateral primary osteoarthritis, left knee: Secondary | ICD-10-CM | POA: Diagnosis not present

## 2022-10-29 DIAGNOSIS — Z96641 Presence of right artificial hip joint: Secondary | ICD-10-CM | POA: Diagnosis not present

## 2022-10-29 DIAGNOSIS — M79604 Pain in right leg: Secondary | ICD-10-CM | POA: Diagnosis not present

## 2022-12-26 DIAGNOSIS — M25562 Pain in left knee: Secondary | ICD-10-CM | POA: Diagnosis not present

## 2023-01-02 DIAGNOSIS — M1712 Unilateral primary osteoarthritis, left knee: Secondary | ICD-10-CM | POA: Diagnosis not present

## 2023-01-12 ENCOUNTER — Other Ambulatory Visit: Payer: Self-pay

## 2023-01-12 DIAGNOSIS — Z79899 Other long term (current) drug therapy: Secondary | ICD-10-CM

## 2023-01-13 ENCOUNTER — Other Ambulatory Visit: Payer: Self-pay

## 2023-01-13 DIAGNOSIS — Z79899 Other long term (current) drug therapy: Secondary | ICD-10-CM

## 2023-01-14 DIAGNOSIS — M1712 Unilateral primary osteoarthritis, left knee: Secondary | ICD-10-CM | POA: Diagnosis not present

## 2023-01-14 LAB — CBC WITH DIFFERENTIAL/PLATELET
Basophils Absolute: 0.1 10*3/uL (ref 0.0–0.2)
Basos: 1 %
EOS (ABSOLUTE): 0.1 10*3/uL (ref 0.0–0.4)
Eos: 2 %
Hematocrit: 40.6 % (ref 34.0–46.6)
Hemoglobin: 13.2 g/dL (ref 11.1–15.9)
Immature Grans (Abs): 0 10*3/uL (ref 0.0–0.1)
Immature Granulocytes: 0 %
Lymphocytes Absolute: 2 10*3/uL (ref 0.7–3.1)
Lymphs: 35 %
MCH: 28.7 pg (ref 26.6–33.0)
MCHC: 32.5 g/dL (ref 31.5–35.7)
MCV: 88 fL (ref 79–97)
Monocytes Absolute: 0.4 10*3/uL (ref 0.1–0.9)
Monocytes: 7 %
Neutrophils Absolute: 3.1 10*3/uL (ref 1.4–7.0)
Neutrophils: 55 %
Platelets: 259 10*3/uL (ref 150–450)
RBC: 4.6 x10E6/uL (ref 3.77–5.28)
RDW: 12.4 % (ref 11.7–15.4)
WBC: 5.6 10*3/uL (ref 3.4–10.8)

## 2023-01-14 LAB — COMPREHENSIVE METABOLIC PANEL
ALT: 28 IU/L (ref 0–32)
AST: 24 IU/L (ref 0–40)
Albumin/Globulin Ratio: 2.3 — ABNORMAL HIGH (ref 1.2–2.2)
Albumin: 4.5 g/dL (ref 3.9–4.9)
Alkaline Phosphatase: 64 IU/L (ref 44–121)
BUN/Creatinine Ratio: 18 (ref 12–28)
BUN: 13 mg/dL (ref 8–27)
Bilirubin Total: 0.9 mg/dL (ref 0.0–1.2)
CO2: 21 mmol/L (ref 20–29)
Calcium: 9.3 mg/dL (ref 8.7–10.3)
Chloride: 106 mmol/L (ref 96–106)
Creatinine, Ser: 0.72 mg/dL (ref 0.57–1.00)
Globulin, Total: 2 g/dL (ref 1.5–4.5)
Glucose: 89 mg/dL (ref 70–99)
Potassium: 4.3 mmol/L (ref 3.5–5.2)
Sodium: 142 mmol/L (ref 134–144)
Total Protein: 6.5 g/dL (ref 6.0–8.5)
eGFR: 94 mL/min/{1.73_m2} (ref >59)

## 2023-01-14 LAB — HEMOGLOBIN A1C
Est. average glucose Bld gHb Est-mCnc: 114 mg/dL
Hgb A1c MFr Bld: 5.6 % (ref 4.8–5.6)

## 2023-01-20 ENCOUNTER — Telehealth: Payer: Self-pay | Admitting: Neurology

## 2023-01-20 NOTE — Telephone Encounter (Signed)
Sheena already spoke with the approval team

## 2023-01-20 NOTE — Telephone Encounter (Signed)
Pt wants to know if we got the surgery clearance form and have we sent it back. She states that they have not got anything back from our office and she is having surgery on Monday

## 2023-01-26 DIAGNOSIS — M1712 Unilateral primary osteoarthritis, left knee: Secondary | ICD-10-CM | POA: Diagnosis not present

## 2023-03-31 DIAGNOSIS — M25572 Pain in left ankle and joints of left foot: Secondary | ICD-10-CM | POA: Diagnosis not present

## 2023-04-08 DIAGNOSIS — M25562 Pain in left knee: Secondary | ICD-10-CM | POA: Diagnosis not present

## 2023-04-08 DIAGNOSIS — M25662 Stiffness of left knee, not elsewhere classified: Secondary | ICD-10-CM | POA: Diagnosis not present

## 2023-04-17 DIAGNOSIS — M25662 Stiffness of left knee, not elsewhere classified: Secondary | ICD-10-CM | POA: Diagnosis not present

## 2023-04-17 DIAGNOSIS — M25562 Pain in left knee: Secondary | ICD-10-CM | POA: Diagnosis not present

## 2023-04-20 DIAGNOSIS — M25662 Stiffness of left knee, not elsewhere classified: Secondary | ICD-10-CM | POA: Diagnosis not present

## 2023-04-20 DIAGNOSIS — M25562 Pain in left knee: Secondary | ICD-10-CM | POA: Diagnosis not present

## 2023-04-22 DIAGNOSIS — M6702 Short Achilles tendon (acquired), left ankle: Secondary | ICD-10-CM | POA: Diagnosis not present

## 2023-04-22 DIAGNOSIS — M7662 Achilles tendinitis, left leg: Secondary | ICD-10-CM | POA: Diagnosis not present

## 2023-04-28 DIAGNOSIS — M25562 Pain in left knee: Secondary | ICD-10-CM | POA: Diagnosis not present

## 2023-04-28 DIAGNOSIS — M25662 Stiffness of left knee, not elsewhere classified: Secondary | ICD-10-CM | POA: Diagnosis not present

## 2023-04-28 DIAGNOSIS — M7662 Achilles tendinitis, left leg: Secondary | ICD-10-CM | POA: Diagnosis not present

## 2023-04-30 DIAGNOSIS — M25662 Stiffness of left knee, not elsewhere classified: Secondary | ICD-10-CM | POA: Diagnosis not present

## 2023-04-30 DIAGNOSIS — M7662 Achilles tendinitis, left leg: Secondary | ICD-10-CM | POA: Diagnosis not present

## 2023-05-04 DIAGNOSIS — M67879 Other specified disorders of synovium and tendon, unspecified ankle and foot: Secondary | ICD-10-CM | POA: Diagnosis not present

## 2023-05-06 DIAGNOSIS — M7662 Achilles tendinitis, left leg: Secondary | ICD-10-CM | POA: Diagnosis not present

## 2023-05-06 DIAGNOSIS — M67872 Other specified disorders of synovium, left ankle and foot: Secondary | ICD-10-CM | POA: Diagnosis not present

## 2023-05-11 DIAGNOSIS — M67872 Other specified disorders of synovium, left ankle and foot: Secondary | ICD-10-CM | POA: Diagnosis not present

## 2023-05-11 DIAGNOSIS — M7662 Achilles tendinitis, left leg: Secondary | ICD-10-CM | POA: Diagnosis not present

## 2023-05-14 DIAGNOSIS — M25662 Stiffness of left knee, not elsewhere classified: Secondary | ICD-10-CM | POA: Diagnosis not present

## 2023-05-14 DIAGNOSIS — M25562 Pain in left knee: Secondary | ICD-10-CM | POA: Diagnosis not present

## 2023-05-18 DIAGNOSIS — M7662 Achilles tendinitis, left leg: Secondary | ICD-10-CM | POA: Diagnosis not present

## 2023-05-18 DIAGNOSIS — M67872 Other specified disorders of synovium, left ankle and foot: Secondary | ICD-10-CM | POA: Diagnosis not present

## 2023-05-20 DIAGNOSIS — M67879 Other specified disorders of synovium and tendon, unspecified ankle and foot: Secondary | ICD-10-CM | POA: Diagnosis not present

## 2023-05-20 DIAGNOSIS — M7662 Achilles tendinitis, left leg: Secondary | ICD-10-CM | POA: Diagnosis not present

## 2023-05-25 DIAGNOSIS — M7662 Achilles tendinitis, left leg: Secondary | ICD-10-CM | POA: Diagnosis not present

## 2023-05-25 DIAGNOSIS — M67872 Other specified disorders of synovium, left ankle and foot: Secondary | ICD-10-CM | POA: Diagnosis not present

## 2023-05-28 DIAGNOSIS — M67879 Other specified disorders of synovium and tendon, unspecified ankle and foot: Secondary | ICD-10-CM | POA: Diagnosis not present

## 2023-06-01 DIAGNOSIS — M67879 Other specified disorders of synovium and tendon, unspecified ankle and foot: Secondary | ICD-10-CM | POA: Diagnosis not present

## 2023-06-01 DIAGNOSIS — M7662 Achilles tendinitis, left leg: Secondary | ICD-10-CM | POA: Diagnosis not present

## 2023-06-03 DIAGNOSIS — M7662 Achilles tendinitis, left leg: Secondary | ICD-10-CM | POA: Diagnosis not present

## 2023-06-03 DIAGNOSIS — M67872 Other specified disorders of synovium, left ankle and foot: Secondary | ICD-10-CM | POA: Diagnosis not present

## 2023-06-09 DIAGNOSIS — M67872 Other specified disorders of synovium, left ankle and foot: Secondary | ICD-10-CM | POA: Diagnosis not present

## 2023-06-11 DIAGNOSIS — M67872 Other specified disorders of synovium, left ankle and foot: Secondary | ICD-10-CM | POA: Diagnosis not present

## 2023-06-11 DIAGNOSIS — M7662 Achilles tendinitis, left leg: Secondary | ICD-10-CM | POA: Diagnosis not present

## 2023-06-16 DIAGNOSIS — M7662 Achilles tendinitis, left leg: Secondary | ICD-10-CM | POA: Diagnosis not present

## 2023-06-16 DIAGNOSIS — M67872 Other specified disorders of synovium, left ankle and foot: Secondary | ICD-10-CM | POA: Diagnosis not present

## 2023-06-18 DIAGNOSIS — M67879 Other specified disorders of synovium and tendon, unspecified ankle and foot: Secondary | ICD-10-CM | POA: Diagnosis not present

## 2023-06-18 DIAGNOSIS — M67872 Other specified disorders of synovium, left ankle and foot: Secondary | ICD-10-CM | POA: Diagnosis not present

## 2023-06-18 DIAGNOSIS — M7662 Achilles tendinitis, left leg: Secondary | ICD-10-CM | POA: Diagnosis not present

## 2023-06-22 DIAGNOSIS — Z96641 Presence of right artificial hip joint: Secondary | ICD-10-CM | POA: Diagnosis not present

## 2023-06-25 DIAGNOSIS — M67872 Other specified disorders of synovium, left ankle and foot: Secondary | ICD-10-CM | POA: Diagnosis not present

## 2023-06-25 DIAGNOSIS — M7662 Achilles tendinitis, left leg: Secondary | ICD-10-CM | POA: Diagnosis not present

## 2023-06-30 DIAGNOSIS — M7662 Achilles tendinitis, left leg: Secondary | ICD-10-CM | POA: Diagnosis not present

## 2023-06-30 DIAGNOSIS — M67872 Other specified disorders of synovium, left ankle and foot: Secondary | ICD-10-CM | POA: Diagnosis not present

## 2023-07-21 DIAGNOSIS — M7662 Achilles tendinitis, left leg: Secondary | ICD-10-CM | POA: Diagnosis not present

## 2023-07-21 DIAGNOSIS — M67872 Other specified disorders of synovium, left ankle and foot: Secondary | ICD-10-CM | POA: Diagnosis not present

## 2023-09-03 DIAGNOSIS — R350 Frequency of micturition: Secondary | ICD-10-CM | POA: Diagnosis not present

## 2023-09-03 DIAGNOSIS — R030 Elevated blood-pressure reading, without diagnosis of hypertension: Secondary | ICD-10-CM | POA: Diagnosis not present

## 2023-09-07 ENCOUNTER — Ambulatory Visit (INDEPENDENT_AMBULATORY_CARE_PROVIDER_SITE_OTHER): Payer: Self-pay | Admitting: Oncology

## 2023-09-07 ENCOUNTER — Other Ambulatory Visit: Payer: Self-pay

## 2023-09-07 ENCOUNTER — Encounter: Payer: Self-pay | Admitting: Oncology

## 2023-09-07 VITALS — BP 134/100 | HR 71 | Temp 98.1°F | Ht 68.0 in | Wt 161.0 lb

## 2023-09-07 DIAGNOSIS — I159 Secondary hypertension, unspecified: Secondary | ICD-10-CM

## 2023-09-07 NOTE — Progress Notes (Signed)
[facility] [address] [place], [place] [address]  Office Visit Note  Patient Name: Ruth Carpenter Date of Birth [id number]  Medical Record number [id number]  Date of Service: 09/07/2023  Chief Complaint  Patient presents with   Hypertension   Ruth Carpenter is a 63 year old female who presents to clinic today for concern for high blood pressure.  Reports symptoms started approximately 2 to 3 weeks ago when she was evaluated at an urgent care for low back pain.  She wanted to rule out a UTI prior to discussing with her orthopedic doctor and at that visit, noticed her blood pressure was higher than usual between 130 and 140 systolically.  She also reports a headache and occasional dizziness that she thought may have been associated.  Since this, she has been checking her blood pressure twice daily once in the morning and once at bedtime with still elevated readings.  She started prednisone for 12 days this past Saturday due to the low back pain and feels like it is starting to improve.  Does not have a primary care doctor and would like to be referred to somebody in [place] possible.  She denies any changes to her medications.  No new supplements.  Does not feel like she is having a lot of stress.  She is the Ruth Carpenter and is relatively active.  No weight gain.  Previous labs back in the summer were WNL.  She has had her right hip and left knee replaced over the past few years.  Current Medication:  Outpatient Encounter Medications as of 09/07/2023  Medication Sig   acetaminophen (TYLENOL) 325 MG tablet Take 650 mg by mouth as needed for pain.   aspirin-acetaminophen-caffeine (EXCEDRIN MIGRAINE) 250-250-65 MG tablet Take by mouth every 6 (six) hours as needed for headache.   diphenhydrAMINE (BENADRYL) 25 mg capsule Take 25 mg by mouth at bedtime as needed.   Multiple Vitamins-Calcium (ONE-A-DAY WOMENS) tablet Take 1 tablet by mouth daily.   Omega-3 Fatty Acids (FISH OIL PO)  Take by mouth daily.   predniSONE (DELTASONE) 5 MG tablet Take 5 mg by mouth as directed.   diclofenac (VOLTAREN) 75 MG EC tablet Take 75 mg by mouth 2 (two) times daily. (Patient not taking: Reported on 09/07/2023)   VITAMIN D PO daily.   [DISCONTINUED] benzonatate (TESSALON) 200 MG capsule Take 1 capsule (200 mg total) by mouth 3 (three) times daily as needed for cough.   [DISCONTINUED] cephALEXin (KEFLEX) 500 MG capsule Take 500 mg by mouth 2 (two) times daily. (Patient not taking: Reported on 08/28/2022)   No facility-administered encounter medications on file as of 09/07/2023.    Medical History: Past Medical History:  Diagnosis Date   Allergy    h/o in past--no ongoing issues   Crohn's disease ([facility])    GI in [place], [place]   Crohn's disease ([facility])    Skin cancer, basal cell    Dr. Nicholas Lose (right shoulder, RLE, back, chest)    Vital Signs: BP (!) 134/100   Pulse 71   Temp 98.1 F (36.7 C)   Ht 5\' 8"  (1.727 m)   Wt 161 lb (73 kg)   SpO2 97%   BMI 24.48 kg/m    Review of Systems  Cardiovascular:  Negative for chest pain and palpitations.       Elevated blood pressure readings  Neurological:  Positive for dizziness and headaches.    Physical Exam Constitutional:      General: She is  not in acute distress.    Appearance: Normal appearance.  Cardiovascular:     Rate and Rhythm: Normal rate and regular rhythm.  Neurological:     Mental Status: She is alert.    Assessment/Plan: High blood pressure Reports this is a new symptom.  Blood pressure in clinic was 130/100. Reports she has always had really good blood pressure and has never had to take anything for her blood pressure.  Several of her family members have high blood pressure and high cholesterol.  Like to have labs drawn to see if anything is changed that may be causing this we did discuss that prednisone can temporarily cause hypertension although sounds like blood pressure was elevated prior to initiating the  prednisone.  Continue to monitor but only take your blood pressure once daily preferably at bedtime.  Provided her with several primary care providers in [place] for her to reach out to you.  Will send results via MyChart when available.  1. Secondary hypertension (Primary) - CBC with Differential/Platelet - Hemoglobin A1c - Vitamin B12 - Folate - TSH - Vitamin D 1,25 dihydroxy - Lipid Profile - Comp Met (CMET) - Vitamin B12 - Iron - Ferritin - Hemoglobin A1c   General Counseling: Ruth Carpenter verbalizes understanding of the findings of todays visit and agrees with plan of treatment. I have discussed any further diagnostic evaluation that may be needed or ordered today. We also reviewed her medications today. she has been encouraged to call the office with any questions or concerns that should arise related to todays visit.  Time spent:30   Durenda Hurt, NP 09/07/2023 12:51 PM

## 2023-09-08 ENCOUNTER — Other Ambulatory Visit: Payer: Self-pay

## 2023-09-08 ENCOUNTER — Encounter: Payer: Self-pay | Admitting: Oncology

## 2023-09-08 DIAGNOSIS — E7800 Pure hypercholesterolemia, unspecified: Secondary | ICD-10-CM

## 2023-09-08 LAB — IRON: Iron: 107 ug/dL (ref 27–139)

## 2023-09-08 LAB — VITAMIN B12: Vitamin B-12: 704 pg/mL (ref 232–1245)

## 2023-09-08 LAB — FERRITIN: Ferritin: 92 ng/mL (ref 15–150)

## 2023-09-09 ENCOUNTER — Other Ambulatory Visit: Payer: Self-pay

## 2023-09-09 DIAGNOSIS — E7800 Pure hypercholesterolemia, unspecified: Secondary | ICD-10-CM

## 2023-09-10 ENCOUNTER — Encounter: Payer: Self-pay | Admitting: Oncology

## 2023-09-10 LAB — LIPID PANEL
Chol/HDL Ratio: 3.4 {ratio} (ref 0.0–4.4)
Cholesterol, Total: 219 mg/dL — ABNORMAL HIGH (ref 100–199)
HDL: 65 mg/dL (ref >39)
LDL Chol Calc (NIH): 125 mg/dL — ABNORMAL HIGH (ref 0–99)
Triglycerides: 166 mg/dL — ABNORMAL HIGH (ref 0–149)
VLDL Cholesterol Cal: 29 mg/dL (ref 5–40)

## 2023-09-14 LAB — CBC WITH DIFFERENTIAL/PLATELET
Basophils Absolute: 0 10*3/uL (ref 0.0–0.2)
Basos: 0 %
EOS (ABSOLUTE): 0 10*3/uL (ref 0.0–0.4)
Eos: 0 %
Hematocrit: 40.5 % (ref 34.0–46.6)
Hemoglobin: 13.3 g/dL (ref 11.1–15.9)
Immature Grans (Abs): 0 10*3/uL (ref 0.0–0.1)
Immature Granulocytes: 0 %
Lymphocytes Absolute: 1.6 10*3/uL (ref 0.7–3.1)
Lymphs: 15 %
MCH: 29 pg (ref 26.6–33.0)
MCHC: 32.8 g/dL (ref 31.5–35.7)
MCV: 88 fL (ref 79–97)
Monocytes Absolute: 0.5 10*3/uL (ref 0.1–0.9)
Monocytes: 5 %
Neutrophils Absolute: 8.8 10*3/uL — ABNORMAL HIGH (ref 1.4–7.0)
Neutrophils: 80 %
Platelets: 339 10*3/uL (ref 150–450)
RBC: 4.58 x10E6/uL (ref 3.77–5.28)
RDW: 12.2 % (ref 11.7–15.4)
WBC: 11 10*3/uL — ABNORMAL HIGH (ref 3.4–10.8)

## 2023-09-14 LAB — LIPID PANEL
Chol/HDL Ratio: 3.3 {ratio} (ref 0.0–4.4)
Cholesterol, Total: 240 mg/dL — ABNORMAL HIGH (ref 100–199)
HDL: 73 mg/dL (ref >39)
LDL Chol Calc (NIH): 138 mg/dL — ABNORMAL HIGH (ref 0–99)
Triglycerides: 164 mg/dL — ABNORMAL HIGH (ref 0–149)
VLDL Cholesterol Cal: 29 mg/dL (ref 5–40)

## 2023-09-14 LAB — COMPREHENSIVE METABOLIC PANEL
ALT: 23 [IU]/L (ref 0–32)
AST: 19 [IU]/L (ref 0–40)
Albumin: 4.8 g/dL (ref 3.9–4.9)
Alkaline Phosphatase: 87 [IU]/L (ref 44–121)
BUN/Creatinine Ratio: 23 (ref 12–28)
BUN: 16 mg/dL (ref 8–27)
Bilirubin Total: 0.9 mg/dL (ref 0.0–1.2)
CO2: 20 mmol/L (ref 20–29)
Calcium: 10 mg/dL (ref 8.7–10.3)
Chloride: 102 mmol/L (ref 96–106)
Creatinine, Ser: 0.7 mg/dL (ref 0.57–1.00)
Globulin, Total: 2.3 g/dL (ref 1.5–4.5)
Glucose: 98 mg/dL (ref 70–99)
Potassium: 3.9 mmol/L (ref 3.5–5.2)
Sodium: 140 mmol/L (ref 134–144)
Total Protein: 7.1 g/dL (ref 6.0–8.5)
eGFR: 97 mL/min/{1.73_m2} (ref >59)

## 2023-09-14 LAB — VITAMIN D 1,25 DIHYDROXY
Vitamin D 1, 25 (OH)2 Total: 78 pg/mL — ABNORMAL HIGH
Vitamin D2 1, 25 (OH)2: <10 pg/mL
Vitamin D3 1, 25 (OH)2: 78 pg/mL

## 2023-09-14 LAB — FOLATE: Folate: >20 ng/mL (ref >3.0)

## 2023-09-14 LAB — HEMOGLOBIN A1C
Est. average glucose Bld gHb Est-mCnc: 108 mg/dL
Hgb A1c MFr Bld: 5.4 % (ref 4.8–5.6)

## 2023-09-14 LAB — TSH: TSH: 0.649 u[IU]/mL (ref 0.450–4.500)

## 2023-09-29 ENCOUNTER — Encounter: Payer: Self-pay | Admitting: Oncology

## 2023-09-29 ENCOUNTER — Ambulatory Visit (INDEPENDENT_AMBULATORY_CARE_PROVIDER_SITE_OTHER): Payer: Self-pay | Admitting: Oncology

## 2023-09-29 ENCOUNTER — Other Ambulatory Visit: Payer: Self-pay

## 2023-09-29 VITALS — BP 132/94 | HR 74 | Temp 98.2°F

## 2023-09-29 DIAGNOSIS — R051 Acute cough: Secondary | ICD-10-CM

## 2023-09-29 MED ORDER — AMOXICILLIN-POT CLAVULANATE 875-125 MG PO TABS: 1.0000 | ORAL_TABLET | Freq: Two times a day (BID) | ORAL | 0 refills | Status: DC

## 2023-09-29 MED ORDER — ALBUTEROL SULFATE HFA 108 (90 BASE) MCG/ACT IN AERS: 2.0000 | INHALATION_SPRAY | Freq: Four times a day (QID) | RESPIRATORY_TRACT | 0 refills | Status: DC | PRN

## 2023-09-29 NOTE — Progress Notes (Signed)
[facility] [address] [place], [place] [address]  Office Visit Note  Patient Name: Ruth Carpenter Date of Birth [id number]  Medical Record number [id number]  Date of Service: 09/29/2023  Chief Complaint  Patient presents with   Acute Visit    Patient c/o sinus pressure, sneezing, cough with dark sputum production, and bilat ear discomfort. Symptoms began 7-8 days ago and have been worsening over time. She has been taking Sudafed during the day and Benadryl at night.   Ruth Carpenter is a 63 year old female who presents to clinic today for possible sinus infection. C/o of sinus congestion, sneezing and cough with sputum production for over 7-8 days. Has been using Flonase morning and night, sudafed, benadryl and tylenol without significant improvement.  Feels like her chest is heavy.  When she coughs she will occasionally cough up dark sputum.  Her voice sounds raspy.  No fevers that she is aware of.  Leaves for [facility] on Thursday for a softball tournament.  Several of her players have been sick.  Current Medication:  Outpatient Encounter Medications as of 09/29/2023  Medication Sig   acetaminophen (TYLENOL) 325 MG tablet Take 650 mg by mouth as needed for pain.   albuterol (VENTOLIN HFA) 108 (90 Base) MCG/ACT inhaler Inhale 2 puffs into the lungs every 6 (six) hours as needed for wheezing or shortness of breath.   amoxicillin-clavulanate (AUGMENTIN) 875-125 MG tablet Take 1 tablet by mouth 2 (two) times daily.   aspirin-acetaminophen-caffeine (EXCEDRIN MIGRAINE) 250-250-65 MG tablet Take by mouth every 6 (six) hours as needed for headache.   diphenhydrAMINE (BENADRYL) 25 mg capsule Take 25 mg by mouth at bedtime as needed.   meloxicam (MOBIC) 15 MG tablet Take 1 tablet by mouth daily.   Multiple Vitamins-Calcium (ONE-A-DAY WOMENS) tablet Take 1 tablet by mouth daily.   Omega-3 Fatty Acids (FISH OIL PO) Take by mouth daily.   VITAMIN D PO daily.   [DISCONTINUED] diclofenac  (VOLTAREN) 75 MG EC tablet Take 75 mg by mouth 2 (two) times daily.   [DISCONTINUED] predniSONE (DELTASONE) 5 MG tablet Take 5 mg by mouth as directed.   No facility-administered encounter medications on file as of 09/29/2023.    Medical History: Past Medical History:  Diagnosis Date   Allergy    h/o in past--no ongoing issues   Crohn's disease ([facility])    GI in [place], [place]   Crohn's disease ([facility])    Skin cancer, basal cell    Dr. Nicholas Lose (right shoulder, RLE, back, chest)    Vital Signs: BP (!) 132/94   Pulse 74   Temp 98.2 F (36.8 C)   SpO2 97%    Review of Systems  Constitutional:  Positive for fatigue.  HENT:  Positive for congestion, ear pain, postnasal drip, sinus pressure, sinus pain and sore throat.   Respiratory:  Positive for cough.   Gastrointestinal:  Negative for diarrhea, nausea and vomiting.  Musculoskeletal:  Negative for myalgias.  Neurological:  Positive for headaches. Negative for dizziness.    Physical Exam Constitutional:      Appearance: Normal appearance.  Cardiovascular:     Rate and Rhythm: Normal rate and regular rhythm.  Pulmonary:     Effort: Pulmonary effort is normal. Prolonged expiration present.     Breath sounds: Examination of the right-upper field reveals wheezing. Examination of the left-upper field reveals wheezing. Wheezing present.  Abdominal:     General: Bowel sounds are normal.     Palpations: Abdomen is soft.  Musculoskeletal:        General: No swelling. Normal range of motion.  Neurological:     Mental Status: She is alert and oriented to person, place, and time. Mental status is at baseline.    Assessment/Plan: 1. Acute cough (Primary) Cough is likely secondary to postnasal drip.  Exam reveals sinus congestion and pressure with postnasal drip and left and right upper lobe wheeze.  She recently was on oral prednisone for back pain and completed her last dose on the 13th.  We will avoid additional oral prednisone.  We  discussed using albuterol inhaler 2 puffs every 4-6 hours for cough and wheeze.  Recommend Augmentin twice daily x 10 days for sinus infection/URI.  We discussed if no improvement within the next couple of days, please let me know.  Continue OTC medications.  - amoxicillin-clavulanate (AUGMENTIN) 875-125 MG tablet; Take 1 tablet by mouth 2 (two) times daily.  Dispense: 20 tablet; Refill: 0 - albuterol (VENTOLIN HFA) 108 (90 Base) MCG/ACT inhaler; Inhale 2 puffs into the lungs every 6 (six) hours as needed for wheezing or shortness of breath.  Dispense: 8 g; Refill: 0   1. Acute cough (Primary) - amoxicillin-clavulanate (AUGMENTIN) 875-125 MG tablet; Take 1 tablet by mouth 2 (two) times daily.  Dispense: 20 tablet; Refill: 0 - albuterol (VENTOLIN HFA) 108 (90 Base) MCG/ACT inhaler; Inhale 2 puffs into the lungs every 6 (six) hours as needed for wheezing or shortness of breath.  Dispense: 8 g; Refill: 0  General Counseling: Ruth Carpenter verbalizes understanding of the findings of todays visit and agrees with plan of treatment. I have discussed any further diagnostic evaluation that may be needed or ordered today. We also reviewed her medications today. she has been encouraged to call the office with any questions or concerns that should arise related to todays visit.  Time spent:30   Durenda Hurt, NP 09/29/2023 12:58 PM

## 2023-10-21 ENCOUNTER — Encounter: Payer: Self-pay | Admitting: Oncology

## 2023-10-21 ENCOUNTER — Other Ambulatory Visit: Payer: Self-pay | Admitting: Oncology

## 2023-10-21 ENCOUNTER — Ambulatory Visit
Admission: RE | Admit: 2023-10-21 | Discharge: 2023-10-21 | Disposition: A | Discharge status: Home / Self Care | Source: Ambulatory Visit | Attending: Oncology | Admitting: Oncology

## 2023-10-21 ENCOUNTER — Other Ambulatory Visit: Payer: Self-pay

## 2023-10-21 ENCOUNTER — Ambulatory Visit (INDEPENDENT_AMBULATORY_CARE_PROVIDER_SITE_OTHER): Payer: Self-pay | Admitting: Oncology

## 2023-10-21 ENCOUNTER — Ambulatory Visit
Admission: RE | Admit: 2023-10-21 | Discharge: 2023-10-21 | Disposition: A | Discharge status: Home / Self Care | Attending: Oncology | Admitting: Oncology

## 2023-10-21 VITALS — BP 135/82 | HR 70 | Temp 98.1°F | Ht 68.0 in | Wt 161.0 lb

## 2023-10-21 DIAGNOSIS — R051 Acute cough: Secondary | ICD-10-CM

## 2023-10-21 DIAGNOSIS — R059 Cough, unspecified: Secondary | ICD-10-CM | POA: Diagnosis not present

## 2023-10-21 MED ORDER — PREDNISONE 5 MG PO TABS: 5.0000 mg | ORAL_TABLET | ORAL | 0 refills | Status: DC

## 2023-10-21 NOTE — Progress Notes (Signed)
 [facility]  [address] [place], [place] [address] Phone: [phone] Fax: [phone]   Office Visit Note  Patient Name: Ruth Carpenter  Date of [id number]  Med Rec number [id number]  Date of Service: 10/21/2023  Guaifenesin er and Morphine and codeine  Chief Complaint  Patient presents with   Chest Pain    Patient c/o chest tightness and a dull pain in her back beneath her left shoulder blade. Ruth Carpenter states the symptoms are similar to what Ruth Carpenter was treated for last month. Ruth Carpenter also reports an intermittent cough and occasional SOB. Denies fever.    HPI Patient is an 63 y.o. for fatigue, chest congestion and raspy since Sunday night/Monday morning. Has been coughing and having some left sided upper back pain that is intermittent but worse when Ruth Carpenter takes a deep breath.  Ruth Carpenter was recently treated for sinus infection with Augmentin with resolution of all of her upper respiratory symptoms.  Reports Ruth Carpenter is leaving for [place] for a softball game tomorrow and became concerned this morning when symptoms had not improved.  Ruth Carpenter has not tried anything over-the-counter.  No fevers.  Occasional cough no production.  Mild postnasal drip.  Has been on dicolfenac and mobic for low back pain.  Current Medication:  Outpatient Encounter Medications as of 10/21/2023  Medication Sig   acetaminophen (TYLENOL) 325 MG tablet Take 650 mg by mouth as needed for pain.   albuterol (VENTOLIN HFA) 108 (90 Base) MCG/ACT inhaler Inhale 2 puffs into the lungs every 6 (six) hours as needed for wheezing or shortness of breath.   aspirin-acetaminophen-caffeine (EXCEDRIN MIGRAINE) 250-250-65 MG tablet Take by mouth every 6 (six) hours as needed for headache.   diclofenac (VOLTAREN) 75 MG EC tablet Take 75 mg by mouth 2 (two) times daily.   diphenhydrAMINE (BENADRYL) 25 mg capsule Take 25 mg by mouth at bedtime as needed.   Multiple Vitamins-Calcium (ONE-A-DAY WOMENS) tablet Take 1 tablet by mouth daily.    Omega-3 Fatty Acids (FISH OIL PO) Take by mouth daily.   VITAMIN D PO daily.   meloxicam (MOBIC) 15 MG tablet Take 1 tablet by mouth daily. (Patient not taking: Reported on 10/21/2023)   [DISCONTINUED] amoxicillin-clavulanate (AUGMENTIN) 875-125 MG tablet Take 1 tablet by mouth 2 (two) times daily.   No facility-administered encounter medications on file as of 10/21/2023.     Medical History: Past Medical History:  Diagnosis Date   Allergy    h/o in past--no ongoing issues   Crohn's disease (HCC)    GI in [place], [place]   Crohn's disease (HCC)    Skin cancer, basal cell    Dr. Nicholas Lose (right shoulder, RLE, back, chest)     Vital Signs: BP 135/82   Pulse 70   Temp 98.1 F (36.7 C)   Ht 5\' 8"  (1.727 m)   Wt 161 lb (73 kg)   SpO2 95%   BMI 24.48 kg/m   ROS: As per HPI.  All other pertinent ROS negative.     Review of Systems  Constitutional:  Positive for fatigue. Negative for chills, diaphoresis and fever.  HENT:  Positive for congestion and voice change. Negative for sinus pressure, sinus pain and sore throat.   Respiratory:  Positive for cough.   Gastrointestinal:  Positive for diarrhea (Loose). Negative for nausea and vomiting.  Neurological:  Negative for dizziness and headaches.    Physical Exam Constitutional:      Appearance: Normal appearance.  Neurological:     Mental  Status: Ruth Carpenter is alert.     No results found for this or any previous visit (from the past 24 hours).  Assessment/Plan: 1. Acute cough (Primary) Exam was mostly benign.  Mildly decreased breath sounds in left upper lobe.  We discussed chest x-ray to rule out any underlying cardiopulmonary disease.  Cardiac exam was WNL.  Vital signs were stable.  Will call with results.   We did discuss the possibility of environmental allergies and recommend using Flonase 2 sprays each nostril once or twice a day along with an antihistamine daily.  - DG Chest 2 View; Future   General Counseling: elleni mozingo understanding of the findings of todays visit and agrees with plan of treatment. I have discussed any further diagnostic evaluation that may be needed or ordered today. We also reviewed her medications today. Ruth Carpenter has been encouraged to call the office with any questions or concerns that should arise related to todays visit.   Orders Placed This Encounter  Procedures   DG Chest 2 View    No orders of the defined types were placed in this encounter.   I spent 25 minutes dedicated to the care of this patient (face-to-face and non-face-to-face) on the date of the encounter to include what is described in the assessment and plan.   Durenda Hurt, NP 10/21/2023 1:50 PM

## 2024-01-08 DIAGNOSIS — M545 Low back pain, unspecified: Secondary | ICD-10-CM | POA: Diagnosis not present

## 2024-01-11 DIAGNOSIS — M545 Low back pain, unspecified: Secondary | ICD-10-CM | POA: Diagnosis not present

## 2024-01-28 DIAGNOSIS — Z96652 Presence of left artificial knee joint: Secondary | ICD-10-CM | POA: Diagnosis not present

## 2024-01-28 DIAGNOSIS — M545 Low back pain, unspecified: Secondary | ICD-10-CM | POA: Diagnosis not present

## 2024-02-09 DIAGNOSIS — M545 Low back pain, unspecified: Secondary | ICD-10-CM | POA: Diagnosis not present

## 2024-02-17 DIAGNOSIS — M545 Low back pain, unspecified: Secondary | ICD-10-CM | POA: Diagnosis not present

## 2024-02-29 DIAGNOSIS — M545 Low back pain, unspecified: Secondary | ICD-10-CM | POA: Diagnosis not present

## 2024-03-07 DIAGNOSIS — M47816 Spondylosis without myelopathy or radiculopathy, lumbar region: Secondary | ICD-10-CM | POA: Diagnosis not present

## 2024-03-27 DIAGNOSIS — M5459 Other low back pain: Secondary | ICD-10-CM | POA: Diagnosis not present

## 2024-04-04 DIAGNOSIS — M5416 Radiculopathy, lumbar region: Secondary | ICD-10-CM | POA: Diagnosis not present

## 2024-04-04 DIAGNOSIS — M545 Low back pain, unspecified: Secondary | ICD-10-CM | POA: Diagnosis not present

## 2024-04-04 DIAGNOSIS — M47816 Spondylosis without myelopathy or radiculopathy, lumbar region: Secondary | ICD-10-CM | POA: Diagnosis not present

## 2024-04-12 DIAGNOSIS — M5416 Radiculopathy, lumbar region: Secondary | ICD-10-CM | POA: Diagnosis not present

## 2024-05-11 ENCOUNTER — Ambulatory Visit (INDEPENDENT_AMBULATORY_CARE_PROVIDER_SITE_OTHER): Payer: Self-pay | Admitting: Adult Health

## 2024-05-11 DIAGNOSIS — R928 Other abnormal and inconclusive findings on diagnostic imaging of breast: Secondary | ICD-10-CM | POA: Diagnosis not present

## 2024-05-11 DIAGNOSIS — N644 Mastodynia: Secondary | ICD-10-CM | POA: Diagnosis not present

## 2024-05-11 DIAGNOSIS — R002 Palpitations: Secondary | ICD-10-CM

## 2024-05-11 DIAGNOSIS — Z029 Encounter for administrative examinations, unspecified: Secondary | ICD-10-CM

## 2024-05-11 LAB — HM MAMMOGRAPHY

## 2024-05-11 NOTE — Progress Notes (Signed)
 Spoke with patient about her Mammogram appt today.  Also about having labs done tomorrow for some ongoing concerns.  Will call her Monday morning with results, and plan to see her Thursday for assessment/EKG possibly.    Juliene DOROTHA Howells DNP, NP-C Nurse Practitioner [facility]

## 2024-05-12 ENCOUNTER — Other Ambulatory Visit: Payer: Self-pay

## 2024-05-12 DIAGNOSIS — R002 Palpitations: Secondary | ICD-10-CM

## 2024-05-12 DIAGNOSIS — Z029 Encounter for administrative examinations, unspecified: Secondary | ICD-10-CM

## 2024-05-13 LAB — CBC WITH DIFFERENTIAL/PLATELET
Basophils Absolute: 0.1 x10E3/uL (ref 0.0–0.2)
Basos: 1 %
EOS (ABSOLUTE): 0.1 x10E3/uL (ref 0.0–0.4)
Eos: 2 %
Hematocrit: 41.5 % (ref 34.0–46.6)
Hemoglobin: 13.6 g/dL (ref 11.1–15.9)
Immature Grans (Abs): 0 x10E3/uL (ref 0.0–0.1)
Immature Granulocytes: 0 %
Lymphocytes Absolute: 1.7 x10E3/uL (ref 0.7–3.1)
Lymphs: 34 %
MCH: 29.4 pg (ref 26.6–33.0)
MCHC: 32.8 g/dL (ref 31.5–35.7)
MCV: 90 fL (ref 79–97)
Monocytes Absolute: 0.4 x10E3/uL (ref 0.1–0.9)
Monocytes: 8 %
Neutrophils Absolute: 2.8 x10E3/uL (ref 1.4–7.0)
Neutrophils: 55 %
Platelets: 244 x10E3/uL (ref 150–450)
RBC: 4.63 x10E6/uL (ref 3.77–5.28)
RDW: 12.5 % (ref 11.7–15.4)
WBC: 5.1 x10E3/uL (ref 3.4–10.8)

## 2024-05-13 LAB — COMPREHENSIVE METABOLIC PANEL WITH GFR
ALT: 40 IU/L — ABNORMAL HIGH (ref 0–32)
AST: 28 IU/L (ref 0–40)
Albumin: 4.6 g/dL (ref 3.9–4.9)
Alkaline Phosphatase: 80 IU/L (ref 49–135)
BUN/Creatinine Ratio: 23 (ref 12–28)
BUN: 19 mg/dL (ref 8–27)
Bilirubin Total: 1.3 mg/dL — ABNORMAL HIGH (ref 0.0–1.2)
CO2: 23 mmol/L (ref 20–29)
Calcium: 10 mg/dL (ref 8.7–10.3)
Chloride: 103 mmol/L (ref 96–106)
Creatinine, Ser: 0.82 mg/dL (ref 0.57–1.00)
Globulin, Total: 2.2 g/dL (ref 1.5–4.5)
Glucose: 95 mg/dL (ref 70–99)
Potassium: 4.3 mmol/L (ref 3.5–5.2)
Sodium: 141 mmol/L (ref 134–144)
Total Protein: 6.8 g/dL (ref 6.0–8.5)
eGFR: 80 mL/min/1.73 (ref >59)

## 2024-05-13 LAB — IRON AND TIBC
Iron Saturation: 24 % (ref 15–55)
Iron: 86 ug/dL (ref 27–139)
Total Iron Binding Capacity: 359 ug/dL (ref 250–450)
UIBC: 273 ug/dL (ref 118–369)

## 2024-05-13 LAB — B12 AND FOLATE PANEL
Folate: >20 ng/mL (ref >3.0)
Vitamin B-12: 478 pg/mL (ref 232–1245)

## 2024-05-13 LAB — HEMOGLOBIN A1C
Est. average glucose Bld gHb Est-mCnc: 111 mg/dL
Hgb A1c MFr Bld: 5.5 % (ref 4.8–5.6)

## 2024-05-13 LAB — SPECIMEN STATUS REPORT

## 2024-05-13 LAB — VITAMIN D 25 HYDROXY (VIT D DEFICIENCY, FRACTURES): Vit D, 25-Hydroxy: 53.6 ng/mL (ref 30.0–100.0)

## 2024-05-13 LAB — TSH: TSH: 1.79 u[IU]/mL (ref 0.450–4.500)

## 2024-05-13 LAB — FERRITIN: Ferritin: 86 ng/mL (ref 15–150)

## 2024-05-17 ENCOUNTER — Ambulatory Visit: Payer: Self-pay | Admitting: Adult Health

## 2024-05-19 ENCOUNTER — Ambulatory Visit (INDEPENDENT_AMBULATORY_CARE_PROVIDER_SITE_OTHER): Payer: Self-pay | Admitting: Adult Health

## 2024-05-19 ENCOUNTER — Other Ambulatory Visit: Payer: Self-pay

## 2024-05-19 VITALS — BP 132/90 | HR 70 | Temp 97.5°F | Ht 68.0 in

## 2024-05-19 DIAGNOSIS — R748 Abnormal levels of other serum enzymes: Secondary | ICD-10-CM

## 2024-05-19 NOTE — Progress Notes (Signed)
 [facility] [address] [place], [place] [address]   Office Visit Note  Patient Name: Ruth Carpenter Date of Birth [id number]  Medical Record number [id number]  Date of Service: 05/19/2024  Chief Complaint  Patient presents with   Follow-up     HPI Pt is here for follow up on labs. She continues to have intermittent chest tingling   Current Medication:  Outpatient Encounter Medications as of 05/19/2024  Medication Sig   acetaminophen (TYLENOL) 325 MG tablet Take 650 mg by mouth as needed for pain.   albuterol  (VENTOLIN  HFA) 108 (90 Base) MCG/ACT inhaler Inhale 2 puffs into the lungs every 6 (six) hours as needed for wheezing or shortness of breath.   aspirin-acetaminophen-caffeine (EXCEDRIN MIGRAINE) 250-250-65 MG tablet Take by mouth every 6 (six) hours as needed for headache.   diphenhydrAMINE (BENADRYL) 25 mg capsule Take 25 mg by mouth at bedtime as needed.   Multiple Vitamins-Calcium (ONE-A-DAY WOMENS) tablet Take 1 tablet by mouth daily.   Omega-3 Fatty Acids (FISH OIL PO) Take by mouth daily.   VITAMIN D  PO daily.   diclofenac (VOLTAREN) 75 MG EC tablet Take 75 mg by mouth 2 (two) times daily. (Patient not taking: Reported on 05/19/2024)   meloxicam  (MOBIC ) 15 MG tablet Take 1 tablet by mouth daily. (Patient not taking: Reported on 05/19/2024)   [DISCONTINUED] predniSONE  (DELTASONE ) 5 MG tablet Take 1 tablet (5 mg total) by mouth as directed. Take 6 tabs n day, 5 tabs on day 2, 4 tabs on day 3 and decrease by one tab until complete.   No facility-administered encounter medications on file as of 05/19/2024.      Medical History: Past Medical History:  Diagnosis Date   Allergy    h/o in past--no ongoing issues   Crohn's disease (HCC)    GI in [place], [place]   Crohn's disease (HCC)    Skin cancer, basal cell    Dr. Cary (right shoulder, RLE, back, chest)     Vital Signs: BP (!) 132/90   Pulse 70   Temp (!) 97.5 F (36.4 C)   Ht 5' 8 (1.727 m)   SpO2  99%   BMI 24.48 kg/m    Review of Systems  Constitutional:  Negative for activity change, appetite change and fever.  HENT:  Negative for congestion, postnasal drip, sinus pain and sore throat.   Eyes:  Negative for pain, discharge and itching.  Respiratory:  Negative for cough and shortness of breath.   Cardiovascular:  Negative for chest pain, palpitations and leg swelling.  Gastrointestinal:  Negative for abdominal distention and abdominal pain.  Endocrine: Negative for cold intolerance and heat intolerance.  Genitourinary:  Negative for difficulty urinating and flank pain.  Musculoskeletal:  Negative for arthralgias and joint swelling.  Skin:  Negative for color change and rash.  Neurological:  Negative for dizziness and headaches.  Hematological:  Negative for adenopathy.  Psychiatric/Behavioral:  Negative for agitation and confusion.     Physical Exam Vitals reviewed.  Constitutional:      Appearance: Normal appearance.  HENT:     Head: Normocephalic.     Nose: Nose normal.  Cardiovascular:     Rate and Rhythm: Normal rate.  Pulmonary:     Effort: Pulmonary effort is normal.     Breath sounds: Normal breath sounds.  Neurological:     Mental Status: She is alert.    Assessment/Plan: 1. Elevated liver enzymes (Primary) Recheck labs in 2 weeks, and get  EKG - Lipid Panel With LDL/HDL Ratio; Future - Comprehensive metabolic panel; Future     General Counseling: guy toney understanding of the findings of todays visit and agrees with plan of treatment. I have discussed any further diagnostic evaluation that may be needed or ordered today. We also reviewed her medications today. she has been encouraged to call the office with any questions or concerns that should arise related to todays visit.   Orders Placed This Encounter  Procedures   Lipid Panel With LDL/HDL Ratio   Comprehensive metabolic panel    No orders of the defined types were placed in this  encounter.   Time spent:15 Minutes    Juliene DOROTHA Howells AGNP-C Nurse Practitioner

## 2024-06-01 ENCOUNTER — Other Ambulatory Visit: Payer: Self-pay

## 2024-06-01 VITALS — BP 140/92

## 2024-06-01 DIAGNOSIS — R002 Palpitations: Secondary | ICD-10-CM

## 2024-06-01 DIAGNOSIS — R748 Abnormal levels of other serum enzymes: Secondary | ICD-10-CM

## 2024-06-02 ENCOUNTER — Other Ambulatory Visit: Payer: Self-pay | Admitting: Adult Health

## 2024-06-02 DIAGNOSIS — R079 Chest pain, unspecified: Secondary | ICD-10-CM

## 2024-06-02 LAB — COMPREHENSIVE METABOLIC PANEL WITH GFR
ALT: 17 IU/L (ref 0–32)
AST: 20 IU/L (ref 0–40)
Albumin: 4.4 g/dL (ref 3.9–4.9)
Alkaline Phosphatase: 70 IU/L (ref 49–135)
BUN/Creatinine Ratio: 20 (ref 12–28)
BUN: 17 mg/dL (ref 8–27)
Bilirubin Total: 1 mg/dL (ref 0.0–1.2)
CO2: 21 mmol/L (ref 20–29)
Calcium: 9.9 mg/dL (ref 8.7–10.3)
Chloride: 106 mmol/L (ref 96–106)
Creatinine, Ser: 0.84 mg/dL (ref 0.57–1.00)
Globulin, Total: 2 g/dL (ref 1.5–4.5)
Glucose: 86 mg/dL (ref 70–99)
Potassium: 4.5 mmol/L (ref 3.5–5.2)
Sodium: 145 mmol/L — ABNORMAL HIGH (ref 134–144)
Total Protein: 6.4 g/dL (ref 6.0–8.5)
eGFR: 78 mL/min/1.73 (ref >59)

## 2024-06-02 LAB — LIPID PANEL WITH LDL/HDL RATIO
Cholesterol, Total: 215 mg/dL — ABNORMAL HIGH (ref 100–199)
HDL: 57 mg/dL (ref >39)
LDL Chol Calc (NIH): 132 mg/dL — ABNORMAL HIGH (ref 0–99)
LDL/HDL Ratio: 2.3 ratio (ref 0.0–3.2)
Triglycerides: 148 mg/dL (ref 0–149)
VLDL Cholesterol Cal: 26 mg/dL (ref 5–40)

## 2024-06-02 NOTE — Progress Notes (Unsigned)
  Virtual Visit Consent   [name], you are scheduled for a virtual visit with a [facility] provider today. Just as with appointments in the office, your consent must be obtained to participate.   I need to obtain your verbal consent now. Are you willing to proceed with your visit today? [name] has provided verbal consent on 06/02/2024 for a virtual visit (video or telephone). [name], NP  Date: 06/02/2024 1:36 PM  Virtual Visit via Video Note   I, [name], connected with  Ruth Carpenter  ([id number], 1961/05/24) on 06/02/24 at  by telephone and verified that I am speaking with the correct person using two identifiers.  Location: Patient: {Virtual Visit Location Patient:25492::Home} Provider: {Virtual Visit Location Provider:25493::Office/Clinic}   I discussed the limitations of evaluation and management by telemedicine and the availability of in person appointments. The patient expressed understanding and agreed to proceed.    History of Present Illness: Ruth Carpenter is a 63 y.o. and is being seen today for ***.  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    Allergies:  Allergies  Allergen Reactions   Guaifenesin Er Diarrhea and Nausea Only   Morphine And Codeine Hives and Itching   Medications:  Current Outpatient Medications:    acetaminophen (TYLENOL) 325 MG tablet, Take 650 mg by mouth as needed for pain., Disp: , Rfl:    albuterol  (VENTOLIN  HFA) 108 (90 Base) MCG/ACT inhaler, Inhale 2 puffs into the lungs every 6 (six) hours as needed for wheezing or shortness of breath., Disp: 8 g, Rfl: 0   aspirin-acetaminophen-caffeine (EXCEDRIN MIGRAINE) 250-250-65 MG tablet, Take by mouth every 6  (six) hours as needed for headache., Disp: , Rfl:    diclofenac (VOLTAREN) 75 MG EC tablet, Take 75 mg by mouth 2 (two) times daily. (Patient not taking: Reported on 05/19/2024), Disp: , Rfl:    diphenhydrAMINE (BENADRYL) 25 mg capsule, Take 25 mg by mouth at bedtime as needed., Disp: , Rfl:    meloxicam  (MOBIC ) 15 MG tablet, Take 1 tablet by mouth daily. (Patient not taking: Reported on 05/19/2024), Disp: , Rfl:    Multiple Vitamins-Calcium (ONE-A-DAY WOMENS) tablet, Take 1 tablet by mouth daily., Disp: , Rfl:    Omega-3 Fatty Acids (FISH OIL PO), Take by mouth daily., Disp: , Rfl:    VITAMIN D  PO, daily., Disp: , Rfl:   Observations/Objective: No labored breathing. *** Speech is clear and coherent with logical content.  Patient is alert and oriented at baseline.  ***  Assessment and Plan: 1. Chest pain, unspecified type (Primary) - Ambulatory referral to Cardiology  ***  Follow Up Instructions: I discussed the assessment and treatment plan with the patient. The patient was provided an opportunity to ask questions and all were answered. The patient agreed with the plan and demonstrated an understanding of the instructions.    The patient was advised to call back or seek an in-person evaluation if the symptoms worsen or if the condition fails to improve as anticipated.  Time:  I spent *** minutes with the patient via telehealth technology discussing the above problems/concerns.    [name], NP

## 2024-06-08 NOTE — Progress Notes (Unsigned)
 Cardiology Office Note    Date:  06/09/2024  ID:  Jameica Couts, DOB 02/16/1961, MRN [id number] PCP:  Geofm Delon BRAVO, NP  Cardiologist:  None - New  Chief Complaint: chest pain, dyspnea, fluttering  History of Present Illness: Ruth Carpenter is a 63 y.o. female with visit-pertinent history of Crohn's disease, HLD and prior minimally elevated LFTs on labs (while on more regular diclofenac) seen for evaluation of chest discomfort at the request of Juliene Howells NP.  She has no prior cardiac history. For the last several weeks she has been experiencing various concerns out of the norm: - sensation of weird feeling on left side of chest primarily at night. Hard to describe discomfort, sometimes possibly like a fluttering sensation, migrates around her chest at times. Had mammogram that was unrevealing. Last night felt her her heart was racing/erratic with HR 90s when she is usually in the 70s - episodic chest pains several times a week, lasting several minutes at a time, coming and going without clear cut pattern, not necessarily associated the above fluttering/awareness. Can happen with and without exertion, sometimes has days where she feels fine, no rhyme or reason. No provoking or relieving factors. No pleuritic discomfort - random bouts of dyspnea over the last several weeks. No orthopnea. Has had minor LE edema - just feels like something is off and it is causing her anxiety because she knows her body and something feels different  She brings in a list of BP readings on paper and paper towel with various readings with range 111/81 to 143/89, with about half the readings being <130 and the other half >130. Note strong family history of cardiac disease as below.  Family History: father had cardiac arrest age 31 unclear cause, mother had CABG between age 8-52 and passed at age 91 in setting of complications related to abdominal infection, oldest brother has afib Tobacco: none  Alcohol:  none Drug use: none   Labwork independently reviewed: 05/2024 LDL 132, trig 148, K 4.5, Na 145, LFTS ok, A1c ok, CBC ok, TSH OK, ferritin wnl  ROS: .    Please see the history of present illness. All other systems are reviewed and otherwise negative.  Studies Reviewed: SABRA    EKG:  EKG is ordered today, personally reviewed, demonstrating   EKG Interpretation Date/Time:  Thursday June 09 2024 14:14:47 EDT Ventricular Rate:  71 PR Interval:  164 QRS Duration:  74 QT Interval:  352 QTC Calculation: 382 R Axis:   5  Text Interpretation: Normal sinus rhythm Possible Left atrial enlargement Nonspecific TWI V2 No acute changes Confirmed by Darya Bigler [phone]) on 06/09/2024 2:24:32 PM     CV Studies: Cardiac studies reviewed are outlined and summarized above. Otherwise please see EMR for full report.   Current Reported Medications:.    Current Meds  Medication Sig   acetaminophen (TYLENOL) 325 MG tablet Take 650 mg by mouth as needed for pain.   aspirin-acetaminophen-caffeine (EXCEDRIN MIGRAINE) 250-250-65 MG tablet Take by mouth every 6 (six) hours as needed for headache.   diclofenac (VOLTAREN) 75 MG EC tablet Take 75 mg by mouth as needed for mild pain (pain score 1-3).   diphenhydrAMINE (BENADRYL) 25 mg capsule Take 25 mg by mouth at bedtime as needed.   Multiple Vitamins-Calcium (ONE-A-DAY WOMENS) tablet Take 1 tablet by mouth daily.   Omega-3 Fatty Acids (FISH OIL PO) Take by mouth daily.   VITAMIN D  PO daily.   [DISCONTINUED] albuterol  (  VENTOLIN  HFA) 108 (90 Base) MCG/ACT inhaler Inhale 2 puffs into the lungs every 6 (six) hours as needed for wheezing or shortness of breath.   [DISCONTINUED] diclofenac (VOLTAREN) 75 MG EC tablet Take 75 mg by mouth 2 (two) times daily.    Physical Exam:    VS:  BP 138/78   Pulse 71   Ht 5' 7 (1.702 m)   Wt 168 lb 12.8 oz (76.6 kg)   SpO2 95%   BMI 26.44 kg/m    Wt Readings from Last 3 Encounters:  06/09/24 168 lb 12.8 oz  (76.6 kg)  10/21/23 161 lb (73 kg)  09/07/23 161 lb (73 kg)    GEN: Well nourished, well developed in no acute distress NECK: No JVD; No carotid bruits CARDIAC: RRR, no murmurs, rubs, gallops RESPIRATORY:  Clear to auscultation without rales, wheezing or rhonchi  ABDOMEN: Soft, non-tender, non-distended EXTREMITIES:  No edema; No acute deformity   Asessement and Plan:.    1. Precordial pain/dyspnea - mixed features, often atypical, but superimposed on very strong family history of cardiac disease recommended for further workup. Will plan cardiac CTA and echocardiogram. She has felt more intermittent dyspnea today than earlier this month but is not sure if this is because she is anxious about what her symptoms might represent. EKG is stable. She is not tachycardic, tachypneic or hypoxic and has no pleuritic chest pain. Recheck labs with H/H, pBNP today.  2. Palpitations - discussed options for 3, 7, 14 day Zio. Per shared decision making, plan 7 day Zio and echocardiogram. Recheck BMET/Mg today given recurrent palpitations last night. Start trial of carvedilol 3.125mg  BID.  3. Hyperlipidemia - await cardiac CTA to infer decision regarding lipid management.  4. Elevated BP without dx of HTN - has been tracking at home, appears to meet criteria for borderline HTN. Given palpitations, dyspnea, and mildly elevated BP, will add trial of carvedilol 3.125mg  BID and follow symptoms.    Disposition: F/u with me in 8 weeks.  Signed, Terilynn Buresh N Morenike Cuff, PA-C

## 2024-06-09 ENCOUNTER — Ambulatory Visit (INDEPENDENT_AMBULATORY_CARE_PROVIDER_SITE_OTHER)

## 2024-06-09 ENCOUNTER — Ambulatory Visit: Attending: Physician Assistant | Admitting: Physician Assistant

## 2024-06-09 ENCOUNTER — Encounter: Payer: Self-pay | Admitting: Physician Assistant

## 2024-06-09 VITALS — BP 138/78 | HR 71 | Ht 67.0 in | Wt 168.8 lb

## 2024-06-09 DIAGNOSIS — R072 Precordial pain: Secondary | ICD-10-CM

## 2024-06-09 DIAGNOSIS — R0600 Dyspnea, unspecified: Secondary | ICD-10-CM

## 2024-06-09 DIAGNOSIS — E785 Hyperlipidemia, unspecified: Secondary | ICD-10-CM

## 2024-06-09 DIAGNOSIS — R002 Palpitations: Secondary | ICD-10-CM | POA: Diagnosis not present

## 2024-06-09 DIAGNOSIS — R0789 Other chest pain: Secondary | ICD-10-CM

## 2024-06-09 DIAGNOSIS — R030 Elevated blood-pressure reading, without diagnosis of hypertension: Secondary | ICD-10-CM

## 2024-06-09 MED ORDER — METOPROLOL TARTRATE 100 MG PO TABS
100.0000 mg | ORAL_TABLET | Freq: Once | ORAL | 0 refills | Status: AC
Start: 2024-06-09 — End: 2024-06-09

## 2024-06-09 MED ORDER — CARVEDILOL 3.125 MG PO TABS
3.1250 mg | ORAL_TABLET | Freq: Two times a day (BID) | ORAL | 3 refills | Status: AC
Start: 2024-06-09 — End: 2024-09-07

## 2024-06-09 NOTE — Patient Instructions (Signed)
 Medication Instructions:  Your physician has recommended you make the following change in your medication:   START Carvedilol 3.125 taking 1 twice a day  *If you need a refill on your cardiac medications before your next appointment, please call your pharmacy*  Lab Work: TODAY:  BMET, MAG, PRO BNP, & H&H  If you have labs (blood work) drawn today and your tests are completely normal, you will receive your results only by: MyChart Message (if you have MyChart) OR A paper copy in the mail If you have any lab test that is abnormal or we need to change your treatment, we will call you to review the results.  Testing/Procedures: Your physician has requested that you have an echocardiogram. Echocardiography is a painless test that uses sound waves to create images of your heart. It provides your doctor with information about the size and shape of your heart and how well your heart's chambers and valves are working. This procedure takes approximately one hour. There are no restrictions for this procedure. Please do NOT wear cologne, perfume, aftershave, or lotions (deodorant is allowed). Please arrive 15 minutes prior to your appointment time.  Please note: We ask at that you not bring children with you during ultrasound (echo/ vascular) testing. Due to room size and safety concerns, children are not allowed in the ultrasound rooms during exams. Our front office staff cannot provide observation of children in our lobby area while testing is being conducted. An adult accompanying a patient to their appointment will only be allowed in the ultrasound room at the discretion of the ultrasound technician under special circumstances. We apologize for any inconvenience.   Your physician has requested that you have cardiac CT. Cardiac computed tomography (CT) is a painless test that uses an x-ray machine to take clear, detailed pictures of your heart. For further information please visit [email or web address]  Please follow instruction sheet BELOW:    Your cardiac CT will be scheduled at one of the below locations:   [facility] [address] [place], [place] [address] [phone] (Severe contrast allergies only)  OR   [facility] [address] [place], [place] [address] [phone]  OR   [facility] [address] [place], [place] [address] [phone]  OR   [facility] [address]  [place], [place] [address]  OR   [facility] [address] [place], [place] [phone]  If scheduled at [facility], please arrive at the [facility] and Children's Entrance (Entrance C2) of [facility] 30 minutes prior to test start time. You can use the FREE valet parking offered at entrance C (encouraged to control the heart rate for the test)  Proceed to the [facility] Radiology Department (first floor) to check-in and test prep.  All radiology patients and guests should use entrance C2 at [facility], accessed from [facility], even though the hospital's physical address listed is [address]  If scheduled at the Heart and Vascular Tower at [address], please enter the parking lot using the [address] entrance and use the FREE valet service at the patient drop-off area. Enter the building and check-in with registration on the main floor.  If scheduled at [facility], please arrive to the Heart and Vascular Center 15 mins early for check-in and test prep.  There is spacious parking and easy access to the radiology department from the [facility]  Heart and Vascular entrance. Please enter here and check-in with the desk attendant.   If scheduled at [facility], please arrive 30 minutes early for check-in and test prep.  Please follow these instructions carefully (unless otherwise directed):  An IV will be required  for this test and Nitroglycerin will be given.    On the Night Before the Test: Be sure to Drink plenty of water. Do not consume any caffeinated/decaffeinated beverages or chocolate 12 hours prior to your test. Do not take any antihistamines 12 hours prior to your test.   On the Day of the Test: Drink plenty of water until 1 hour prior to the test. Do not eat any food 1 hour prior to test. You may take your regular medications prior to the test.  HOLD THE CARVEDILOL ON THE DAY OF THE CARDIAC CT AND Take metoprolol (Lopressor) 100 MG two hours prior to test. THIS HAS BEEN SENT TO [facility]- please wear underwire-free bra if available, avoid dresses & tight clothing      After the Test: Drink plenty of water. After receiving IV contrast, you may experience a mild flushed feeling. This is normal. On occasion, you may experience a mild rash up to 24 hours after the test. This is not dangerous. If this occurs, you can take Benadryl 25 mg, Zyrtec, Claritin, or Allegra and increase your fluid intake. (Patients taking Tikosyn should avoid Benadryl, and may take Zyrtec, Claritin, or Allegra) If you experience trouble breathing, this can be serious. If it is severe call 911 IMMEDIATELY. If it is mild, please call our office.  We will call to schedule your test 2-4 weeks out understanding that some insurance companies will need an authorization prior to the service being performed.   For more information and frequently asked questions, please visit our website : [email or web address]  For non-scheduling related questions, please contact the cardiac imaging nurse navigator should you have any questions/concerns: Cardiac Imaging Nurse Navigators Direct Office Dial: [phone]   For scheduling needs, including cancellations and rescheduling, please call Brittany, [phone].    ZIO XT- Long Term Monitor Instructions  Your physician has requested you wear a ZIO patch  monitor for 7 days.  This is a single patch monitor. [employer] supplies one patch monitor per enrollment. Additional stickers are not available. Please do not apply patch if you will be having a Nuclear Stress Test,  Echocardiogram, Cardiac CT, MRI, or Chest Xray during the period you would be wearing the  monitor. The patch cannot be worn during these tests. You cannot remove and re-apply the  ZIO XT patch monitor.  Your ZIO patch monitor will be mailed 3 day USPS to your address on file. It may take 3-5 days  to receive your monitor after you have been enrolled.  Once you have received your monitor, please review the enclosed instructions. Your monitor  has already been registered assigning a specific monitor serial # to you.  Billing and Patient Assistance Program Information  We have supplied [employer] with any of your insurance information on file for billing purposes. [employer] offers a sliding scale Patient Assistance Program for patients that do not have  insurance, or whose insurance does not completely cover the cost of the ZIO monitor.  You must apply for the Patient Assistance Program to qualify for this discounted rate.  To apply, please call [employer] at [phone], select option 4, select option 2, ask to apply for  Patient Assistance Program. Meredeth will ask your household  income, and how many people  are in your household. They will quote your out-of-pocket cost based on that information.  [employer] will also be able to set up a 38-month, interest-free payment plan if needed.  Applying the monitor   Shave hair from upper left chest.  Hold abrader disc by orange tab. Rub abrader in 40 strokes over the upper left chest as  indicated in your monitor instructions.  Clean area with 4 enclosed alcohol pads. Let dry.  Apply patch as indicated in monitor instructions. Patch will be placed under collarbone on left  side of chest with arrow pointing upward.  Rub patch adhesive wings for  2 minutes. Remove white label marked 1. Remove the white  label marked 2. Rub patch adhesive wings for 2 additional minutes.  While looking in a mirror, press and release button in center of patch. A small green light will  flash 3-4 times. This will be your only indicator that the monitor has been turned on.  Do not shower for the first 24 hours. You may shower after the first 24 hours.  Press the button if you feel a symptom. You will hear a small click. Record Date, Time and  Symptom in the Patient Logbook.  When you are ready to remove the patch, follow instructions on the last 2 pages of Patient  Logbook. Stick patch monitor onto the last page of Patient Logbook.  Place Patient Logbook in the blue and white box. Use locking tab on box and tape box closed  securely. The blue and white box has prepaid postage on it. Please place it in the mailbox as  soon as possible. Your physician should have your test results approximately 7 days after the  monitor has been mailed back to [facility].  Call [facility] Customer Care at [phone] if you have questions regarding  your ZIO XT patch monitor. Call them immediately if you see an orange light blinking on your  monitor.  If your monitor falls off in less than 4 days, contact our Monitor department at [phone].  If your monitor becomes loose or falls off after 4 days call [employer] at [phone] for  suggestions on securing your monitor  Follow-Up: At [facility], you and your health needs are our priority.  As part of our continuing mission to provide you with exceptional heart care, our providers are all part of one team.  This team includes your primary Cardiologist (physician) and Advanced Practice Providers or APPs (Physician Assistants and Nurse Practitioners) who all work together to provide you with the care you need, when you need it.  Your next appointment:   8 week(s)  Provider:   Dayna Dunn, PA-C           We recommend signing up for the patient portal called MyChart.  Sign up information is provided on this After Visit Summary.  MyChart is used to connect with patients for Virtual Visits (Telemedicine).  Patients are able to view lab/test results, encounter notes, upcoming appointments, etc.  Non-urgent messages can be sent to your provider as well.   To learn more about what you can do with MyChart, go to [email or web address].   Other Instructions

## 2024-06-09 NOTE — Progress Notes (Unsigned)
 Enrolled patient for a 7 day Zio XT monitor to be mailed to patients home.

## 2024-06-10 ENCOUNTER — Ambulatory Visit: Payer: Self-pay | Admitting: Physician Assistant

## 2024-06-10 DIAGNOSIS — E785 Hyperlipidemia, unspecified: Secondary | ICD-10-CM

## 2024-06-10 DIAGNOSIS — R030 Elevated blood-pressure reading, without diagnosis of hypertension: Secondary | ICD-10-CM

## 2024-06-10 LAB — BASIC METABOLIC PANEL WITH GFR
BUN/Creatinine Ratio: 18 (ref 12–28)
BUN: 16 mg/dL (ref 8–27)
CO2: 23 mmol/L (ref 20–29)
Calcium: 10 mg/dL (ref 8.7–10.3)
Chloride: 104 mmol/L (ref 96–106)
Creatinine, Ser: 0.87 mg/dL (ref 0.57–1.00)
Glucose: 87 mg/dL (ref 70–99)
Potassium: 4.6 mmol/L (ref 3.5–5.2)
Sodium: 144 mmol/L (ref 134–144)
eGFR: 74 mL/min/1.73 (ref >59)

## 2024-06-10 LAB — HEMOGLOBIN AND HEMATOCRIT, BLOOD
Hematocrit: 40 % (ref 34.0–46.6)
Hemoglobin: 13.3 g/dL (ref 11.1–15.9)

## 2024-06-10 LAB — MAGNESIUM: Magnesium: 2 mg/dL (ref 1.6–2.3)

## 2024-06-10 LAB — PRO B NATRIURETIC PEPTIDE: NT-Pro BNP: <36 pg/mL (ref 0–287)

## 2024-06-17 ENCOUNTER — Encounter (HOSPITAL_COMMUNITY): Payer: Self-pay

## 2024-06-20 ENCOUNTER — Ambulatory Visit (HOSPITAL_COMMUNITY)
Admission: RE | Admit: 2024-06-20 | Discharge: 2024-06-20 | Disposition: A | Discharge status: Home / Self Care | Source: Ambulatory Visit | Attending: Physician Assistant | Admitting: Physician Assistant

## 2024-06-20 DIAGNOSIS — R0600 Dyspnea, unspecified: Secondary | ICD-10-CM | POA: Insufficient documentation

## 2024-06-20 DIAGNOSIS — R072 Precordial pain: Secondary | ICD-10-CM | POA: Diagnosis not present

## 2024-06-20 DIAGNOSIS — I2510 Atherosclerotic heart disease of native coronary artery without angina pectoris: Secondary | ICD-10-CM | POA: Diagnosis not present

## 2024-06-20 MED ORDER — NITROGLYCERIN 0.4 MG SL SUBL
0.8000 mg | SUBLINGUAL_TABLET | Freq: Once | SUBLINGUAL | Status: AC
  Administered 2024-06-20: 0.8 mg via SUBLINGUAL

## 2024-06-20 MED ORDER — IOHEXOL 350 MG/ML SOLN
95.0000 mL | Freq: Once | INTRAVENOUS | Status: AC | PRN
  Administered 2024-06-20: 95 mL via INTRAVENOUS

## 2024-06-22 DIAGNOSIS — R072 Precordial pain: Secondary | ICD-10-CM | POA: Diagnosis not present

## 2024-06-22 DIAGNOSIS — R002 Palpitations: Secondary | ICD-10-CM | POA: Diagnosis not present

## 2024-06-23 ENCOUNTER — Ambulatory Visit (HOSPITAL_COMMUNITY)
Admission: RE | Admit: 2024-06-23 | Discharge: 2024-06-23 | Disposition: A | Discharge status: Home / Self Care | Source: Ambulatory Visit | Attending: Cardiology | Admitting: Cardiology

## 2024-06-23 DIAGNOSIS — R0600 Dyspnea, unspecified: Secondary | ICD-10-CM | POA: Insufficient documentation

## 2024-06-23 DIAGNOSIS — E785 Hyperlipidemia, unspecified: Secondary | ICD-10-CM | POA: Insufficient documentation

## 2024-06-23 DIAGNOSIS — R002 Palpitations: Secondary | ICD-10-CM | POA: Insufficient documentation

## 2024-06-23 DIAGNOSIS — R030 Elevated blood-pressure reading, without diagnosis of hypertension: Secondary | ICD-10-CM | POA: Insufficient documentation

## 2024-06-23 DIAGNOSIS — R072 Precordial pain: Secondary | ICD-10-CM | POA: Diagnosis not present

## 2024-06-23 LAB — ECHOCARDIOGRAM COMPLETE
Area-P 1/2: 3.37 cm2
S' Lateral: 3.13 cm

## 2024-06-24 ENCOUNTER — Other Ambulatory Visit: Payer: Self-pay | Admitting: Physician Assistant

## 2024-06-24 ENCOUNTER — Other Ambulatory Visit: Payer: Self-pay

## 2024-06-24 DIAGNOSIS — E785 Hyperlipidemia, unspecified: Secondary | ICD-10-CM

## 2024-06-24 MED ORDER — LISINOPRIL 5 MG PO TABS: 5.0000 mg | ORAL_TABLET | Freq: Every day | ORAL | 0 refills | Status: DC

## 2024-06-24 MED ORDER — ROSUVASTATIN CALCIUM 10 MG PO TABS: 10.0000 mg | ORAL_TABLET | Freq: Every day | ORAL | 0 refills | Status: DC

## 2024-06-24 NOTE — Addendum Note (Signed)
 Addended by: JOSHUA ANDREZ PARAS on: 06/24/2024 04:22 PM   Modules accepted: Orders

## 2024-06-28 DIAGNOSIS — R002 Palpitations: Secondary | ICD-10-CM | POA: Diagnosis not present

## 2024-06-28 DIAGNOSIS — R030 Elevated blood-pressure reading, without diagnosis of hypertension: Secondary | ICD-10-CM

## 2024-06-28 DIAGNOSIS — R0600 Dyspnea, unspecified: Secondary | ICD-10-CM

## 2024-06-28 DIAGNOSIS — E785 Hyperlipidemia, unspecified: Secondary | ICD-10-CM | POA: Diagnosis not present

## 2024-06-28 DIAGNOSIS — R072 Precordial pain: Secondary | ICD-10-CM

## 2024-07-11 DIAGNOSIS — R030 Elevated blood-pressure reading, without diagnosis of hypertension: Secondary | ICD-10-CM | POA: Diagnosis not present

## 2024-07-11 DIAGNOSIS — E785 Hyperlipidemia, unspecified: Secondary | ICD-10-CM | POA: Diagnosis not present

## 2024-07-12 LAB — BASIC METABOLIC PANEL WITH GFR
BUN/Creatinine Ratio: 21 (ref 12–28)
BUN: 16 mg/dL (ref 8–27)
CO2: 22 mmol/L (ref 20–29)
Calcium: 9.4 mg/dL (ref 8.7–10.3)
Chloride: 101 mmol/L (ref 96–106)
Creatinine, Ser: 0.77 mg/dL (ref 0.57–1.00)
Glucose: 83 mg/dL (ref 70–99)
Potassium: 4 mmol/L (ref 3.5–5.2)
Sodium: 138 mmol/L (ref 134–144)
eGFR: 86 mL/min/1.73 (ref >59)

## 2024-07-12 LAB — HEPATIC FUNCTION PANEL
ALT: 25 IU/L (ref 0–32)
AST: 24 IU/L (ref 0–40)
Albumin: 4.3 g/dL (ref 3.9–4.9)
Alkaline Phosphatase: 67 IU/L (ref 49–135)
Bilirubin Total: 1.1 mg/dL (ref 0.0–1.2)
Bilirubin, Direct: 0.29 mg/dL (ref 0.00–0.40)
Total Protein: 6.4 g/dL (ref 6.0–8.5)

## 2024-07-25 NOTE — Progress Notes (Unsigned)
 Cardiology Office Note    Date:  07/28/2024  ID:  Ruth Carpenter, DOB 16-Jul-1961, MRN [id number] PCP:  Geofm Delon BRAVO, NP  Cardiologist: TBD. [facility] - Bonnie Roig, PA Electrophysiologist:  None   Chief Complaint: return to discuss results - cor CTA, echo, Zio  History of Present Illness: Ruth Carpenter is a 63 y.o. female with visit-pertinent history of Crohn's disease, HLD, prior minimally elevated LFTs on labs (while on more regular diclofenac), recently diagnosed nonobstructive CAD, mild AI, brief NSVT/PSVT and rare ectopy seen for follow-up. She established care in 05/2024 for intermittent atypical chest pain, dyspnea, and possible palpitations, see note for full details. Blood pressure was elevated. We discussed initiation of carvedilol ; patient was concerned about side effects and requested lisinopril  instead as her family had tolerated this well. Coronary CTA 06/2024 showed CAC 1245/99%ile but mild nonobstructive CAD with 25-49% prox LAD, 25-49% D1, 1-24% LCx, 1-24% RCA, visually cannot exclude PFO, overread OK. Recommended to start ASA 81mg  and rosuvastatin . 2D echo 06/2024 showed EF 55-60%, G1DD, trivial MR, mild AI, trivial pericardial effusion without tamponade. 7 day Zio showed NSR, range 47-179bpm, 1 NSVT (11 beats), 11 PSVT (longest 9 beats/possible atrial tachycardia), rare PACs/PVCs. Some triggered events correlated with the skips, other times NSR.   She returns for follow-up doing well. She reports since starting rosuvastatin  and lisinopril  she has overall felt better. The palpitations are essentially rare at this point. No chest pain, dyspnea, syncope reported. She had questions about fruit/vegetable supplements that I did my best to answer. We discussed that these are often unregulated and my recommendation would be to stick with whole, natural foods as supplementation. She and her wife lost their 54 year old beagle a few weeks ago due to breathing complications following a  prior diagnosis of rectal cancer.   Labwork independently reviewed: 07/2024 LFTS wnl drawn too early, K 4.0, Cr 0.77 05/2024 pBNP wnl, H/H OK, Mg wnl, LDL 132, trig 148, K 4.5, Na 145, LFTS ok, A1c ok, CBC ok, TSH OK, ferritin wnl   ROS: .    Please see the history of present illness. Otherwise, review of systems is positive for chronic episodic difficulty sleeping, not orthopnea.  All other systems are reviewed and otherwise negative.  Studies Reviewed: SABRA    EKG:  EKG is not ordered today  CV Studies: Cardiac studies reviewed are outlined and summarized above. Otherwise please see EMR for full report.   Current Reported Medications:.    Prior to Admission medications  Medication Sig Start Date End Date Taking? Authorizing Provider  acetaminophen (TYLENOL) 325 MG tablet Take 650 mg by mouth as needed for pain.   Yes [provider]  aspirin -acetaminophen-caffeine (EXCEDRIN MIGRAINE) 250-250-65 MG tablet Take by mouth every 6 (six) hours as needed for headache.   Yes [provider]  diclofenac (VOLTAREN) 75 MG EC tablet Take 75 mg by mouth as needed for mild pain (pain score 1-3).   Yes [provider]  diphenhydrAMINE (BENADRYL) 25 mg capsule Take 25 mg by mouth at bedtime as needed.   Yes [provider]  lisinopril  (ZESTRIL ) 5 MG tablet Take 1 tablet (5 mg total) by mouth daily. 06/28/24  Yes Brannon Levene N, PA-C  Multiple Vitamins-Calcium  (ONE-A-DAY WOMENS) tablet Take 1 tablet by mouth daily.   Yes [provider]  Omega-3 Fatty Acids (FISH OIL PO) Take by mouth daily.   Yes [provider]  rosuvastatin  (CRESTOR ) 10 MG tablet Take  1 tablet (10 mg total) by mouth daily. 06/28/24  Yes Jamontae Thwaites N, PA-C  VITAMIN D  PO daily.   Yes [provider]     Physical Exam:    VS:  BP 110/70 (BP Location: Left Arm, Patient Position: Sitting, Cuff Size: Normal)   Pulse 68   Ht 5' 7 (1.702 m)   Wt 170 lb (77.1 kg)   BMI  26.63 kg/m    Wt Readings from Last 3 Encounters:  07/28/24 170 lb (77.1 kg)  06/09/24 168 lb 12.8 oz (76.6 kg)  10/21/23 161 lb (73 kg)    GEN: Well nourished, well developed in no acute distress NECK: No JVD; No carotid bruits CARDIAC: RRR, no murmurs, rubs, gallops RESPIRATORY:  Clear to auscultation without rales, wheezing or rhonchi  ABDOMEN: Soft, non-tender, non-distended EXTREMITIES:  No edema; No acute deformity   Asessement and Plan:.    1. CAD, HLD - discussed nonobstructive findings on CT; no anginal-type symptoms at this time. Continue preventative therapy to include ASA 81mg  daily, rosuvastatin  10mg  daily. Plan recheck FLP + ALT in mid January. She has some additional PRNs on her med list to take note of but does not take regularly - excedrine migraine, diclofenac. Should be OK for rare episodic use, discussed avoidance of excess aspirin  regularly given maintenance dosing.  2. NSVT, PSVT, rare ectopy - relatively low burden by monitor, with some correlation with skips, other times NSR. We had discussed beta blocker and she wished to defer due to concern of weight gain/side effects. Thankfully, her palpitations have settled down on their own. EF wnl, cor CTA as above. Recent lytes OK. Will continue with conservative approach but can reconsider beta blocker if symptoms re-emerge.  3. HTN - controlled with addition of lisinopril . I asked her to periodically monitor BP at home as she was doing before and notify if readings tending to trend 130 systolic or higher.  4. Mild AI - anticipate repeat echo 3-5 years.  5. Trivial pericardial effusion - essentially physiologic without any s/sx of pericarditis. No further intervention needed at this time.     Disposition: F/u with Dr. Floretta to establish care in 6 months, then likely yearly thereafter.  Signed, Natosha Bou N Tumeka Chimenti, PA-C

## 2024-07-28 ENCOUNTER — Encounter: Payer: Self-pay | Admitting: Physician Assistant

## 2024-07-28 ENCOUNTER — Ambulatory Visit: Admitting: Physician Assistant

## 2024-07-28 VITALS — BP 110/70 | HR 68 | Ht 67.0 in | Wt 170.0 lb

## 2024-07-28 DIAGNOSIS — I2510 Atherosclerotic heart disease of native coronary artery without angina pectoris: Secondary | ICD-10-CM

## 2024-07-28 DIAGNOSIS — I471 Supraventricular tachycardia, unspecified: Secondary | ICD-10-CM | POA: Diagnosis not present

## 2024-07-28 DIAGNOSIS — I10 Essential (primary) hypertension: Secondary | ICD-10-CM

## 2024-07-28 DIAGNOSIS — I351 Nonrheumatic aortic (valve) insufficiency: Secondary | ICD-10-CM

## 2024-07-28 DIAGNOSIS — E785 Hyperlipidemia, unspecified: Secondary | ICD-10-CM

## 2024-07-28 DIAGNOSIS — I3139 Other pericardial effusion (noninflammatory): Secondary | ICD-10-CM

## 2024-07-28 DIAGNOSIS — I4729 Other ventricular tachycardia: Secondary | ICD-10-CM

## 2024-07-28 MED ORDER — ASPIRIN EC 81 MG PO TBEC: 81.0000 mg | DELAYED_RELEASE_TABLET | Freq: Every day | ORAL | 1 refills | Status: AC

## 2024-07-28 NOTE — Patient Instructions (Addendum)
 Medication Instructions:  START TAKING ASPIRIN  81 MG DAILY.  Lab Work: FASTING LIPID PANEL AND ALT TO BE DONE TODAY.  Testing/Procedures: NONE  Follow-Up: At [facility], you and your health needs are our priority.  As part of our continuing mission to provide you with exceptional heart care, our providers are all part of one team.  This team includes your primary Cardiologist (physician) and Advanced Practice Providers or APPs (Physician Assistants and Nurse Practitioners) who all work together to provide you with the care you need, when you need it.  Your next appointment:   6 MONTHS TO ESTABLISH CARE WITH DR. GEORGANNA ARCHER  Provider:   Georganna Archer, MD    OTHER INSTRUCTIONS: PLEASE NOTIFY OUR OFFICE IF YOUR TOP BLOOD PRESSURE NUMBER IN GREATER THAN 130.

## 2024-08-05 DIAGNOSIS — J029 Acute pharyngitis, unspecified: Secondary | ICD-10-CM | POA: Diagnosis not present

## 2024-08-09 DIAGNOSIS — J0100 Acute maxillary sinusitis, unspecified: Secondary | ICD-10-CM | POA: Diagnosis not present

## 2024-08-09 DIAGNOSIS — B9689 Other specified bacterial agents as the cause of diseases classified elsewhere: Secondary | ICD-10-CM | POA: Diagnosis not present
